# Patient Record
Sex: Male | Born: 1935 | Race: White | Hispanic: No | Marital: Single | Smoking: Former smoker
Health system: Southern US, Community
[De-identification: ages and names within clinical notes are randomized; demographics above are authoritative.]

## PROBLEM LIST (undated history)

## (undated) DIAGNOSIS — L98499 Non-pressure chronic ulcer of skin of other sites with unspecified severity: Secondary | ICD-10-CM

## (undated) DIAGNOSIS — G4701 Insomnia due to medical condition: Secondary | ICD-10-CM

## (undated) DIAGNOSIS — B353 Tinea pedis: Secondary | ICD-10-CM

## (undated) DIAGNOSIS — F329 Major depressive disorder, single episode, unspecified: Secondary | ICD-10-CM

## (undated) DIAGNOSIS — R110 Nausea: Secondary | ICD-10-CM

## (undated) DIAGNOSIS — R079 Chest pain, unspecified: Secondary | ICD-10-CM

## (undated) DIAGNOSIS — Z79899 Other long term (current) drug therapy: Secondary | ICD-10-CM

## (undated) DIAGNOSIS — G4700 Insomnia, unspecified: Secondary | ICD-10-CM

## (undated) DIAGNOSIS — R1013 Epigastric pain: Secondary | ICD-10-CM

## (undated) DIAGNOSIS — R197 Diarrhea, unspecified: Secondary | ICD-10-CM

## (undated) DIAGNOSIS — K3189 Other diseases of stomach and duodenum: Secondary | ICD-10-CM

## (undated) DIAGNOSIS — IMO0001 Reserved for inherently not codable concepts without codable children: Secondary | ICD-10-CM

## (undated) DIAGNOSIS — Z125 Encounter for screening for malignant neoplasm of prostate: Secondary | ICD-10-CM

## (undated) DIAGNOSIS — N189 Chronic kidney disease, unspecified: Secondary | ICD-10-CM

## (undated) DIAGNOSIS — R5383 Other fatigue: Secondary | ICD-10-CM

## (undated) DIAGNOSIS — R5381 Other malaise: Secondary | ICD-10-CM

## (undated) DIAGNOSIS — E039 Hypothyroidism, unspecified: Secondary | ICD-10-CM

## (undated) DIAGNOSIS — R9431 Abnormal electrocardiogram [ECG] [EKG]: Secondary | ICD-10-CM

## (undated) DIAGNOSIS — I4949 Other premature depolarization: Secondary | ICD-10-CM

## (undated) DIAGNOSIS — N401 Enlarged prostate with lower urinary tract symptoms: Secondary | ICD-10-CM

## (undated) DIAGNOSIS — N138 Other obstructive and reflux uropathy: Secondary | ICD-10-CM

## (undated) DIAGNOSIS — N400 Enlarged prostate without lower urinary tract symptoms: Secondary | ICD-10-CM

## (undated) DIAGNOSIS — I10 Essential (primary) hypertension: Secondary | ICD-10-CM

## (undated) HISTORY — DX: Insomnia, unspecified: G47.00

## (undated) HISTORY — PX: APPENDECTOMY: SHX54

## (undated) HISTORY — DX: Epigastric pain: R10.13

## (undated) HISTORY — DX: Benign prostatic hyperplasia with lower urinary tract symptoms: N40.1

## (undated) HISTORY — DX: Chronic kidney disease, unspecified: N18.9

## (undated) HISTORY — DX: Chest pain, unspecified: R07.9

## (undated) HISTORY — PX: NOSE SURGERY: SHX723

## (undated) HISTORY — DX: Other malaise: R53.83

## (undated) HISTORY — DX: Non-pressure chronic ulcer of skin of other sites with unspecified severity: L98.499

## (undated) HISTORY — DX: Benign prostatic hyperplasia without lower urinary tract symptoms: N40.0

## (undated) HISTORY — PX: PROSTATE SURGERY: SHX751

## (undated) HISTORY — DX: Insomnia due to medical condition: G47.01

## (undated) HISTORY — DX: Reserved for inherently not codable concepts without codable children: IMO0001

## (undated) HISTORY — DX: Other long term (current) drug therapy: Z79.899

## (undated) HISTORY — DX: Other obstructive and reflux uropathy: N13.8

## (undated) HISTORY — DX: Hypothyroidism, unspecified: E03.9

## (undated) HISTORY — DX: Other fatigue: R53.81

## (undated) HISTORY — DX: Encounter for screening for malignant neoplasm of prostate: Z12.5

## (undated) HISTORY — DX: Nausea: R11.0

## (undated) HISTORY — DX: Other diseases of stomach and duodenum: K31.89

## (undated) HISTORY — DX: Major depressive disorder, single episode, unspecified: F32.9

## (undated) HISTORY — PX: CATARACT EXTRACTION, BILATERAL: SHX1313

## (undated) HISTORY — PX: HERNIA REPAIR: SHX51

## (undated) HISTORY — DX: Essential (primary) hypertension: I10

## (undated) HISTORY — DX: Abnormal electrocardiogram (ECG) (EKG): R94.31

## (undated) HISTORY — DX: Diarrhea, unspecified: R19.7

## (undated) HISTORY — DX: Other premature depolarization: I49.49

## (undated) HISTORY — DX: Tinea pedis: B35.3

---

## 2002-08-24 HISTORY — PX: COLONOSCOPY: SHX174

## 2002-11-23 ENCOUNTER — Ambulatory Visit (HOSPITAL_COMMUNITY): Admission: RE | Admit: 2002-11-23 | Discharge: 2002-11-23 | Payer: Self-pay | Admitting: Gastroenterology

## 2002-11-23 ENCOUNTER — Encounter: Payer: Self-pay | Admitting: Gastroenterology

## 2003-11-14 ENCOUNTER — Encounter: Admission: RE | Admit: 2003-11-14 | Discharge: 2003-11-14 | Payer: Self-pay | Admitting: Internal Medicine

## 2005-04-18 IMAGING — CR DG CHEST 2V
2 series · 2 of 2 positions shown · non-contrast
Comparison: none

CLINICAL DATA: Chest pain. 
 TWO VIEW CHEST
 PA and lateral views without comparison films reveal the heart size to be normal with mild dilatation of the aorta.  There is hyperaeration of the lung fields with prominence of the markings.  No active findings.  Spurring is noted of the lower thoracic spine. 
 IMPRESSION
 Chronic changes are noted particularly in the lung apices.  No active findings.

[view not recorded (1 of 2)]
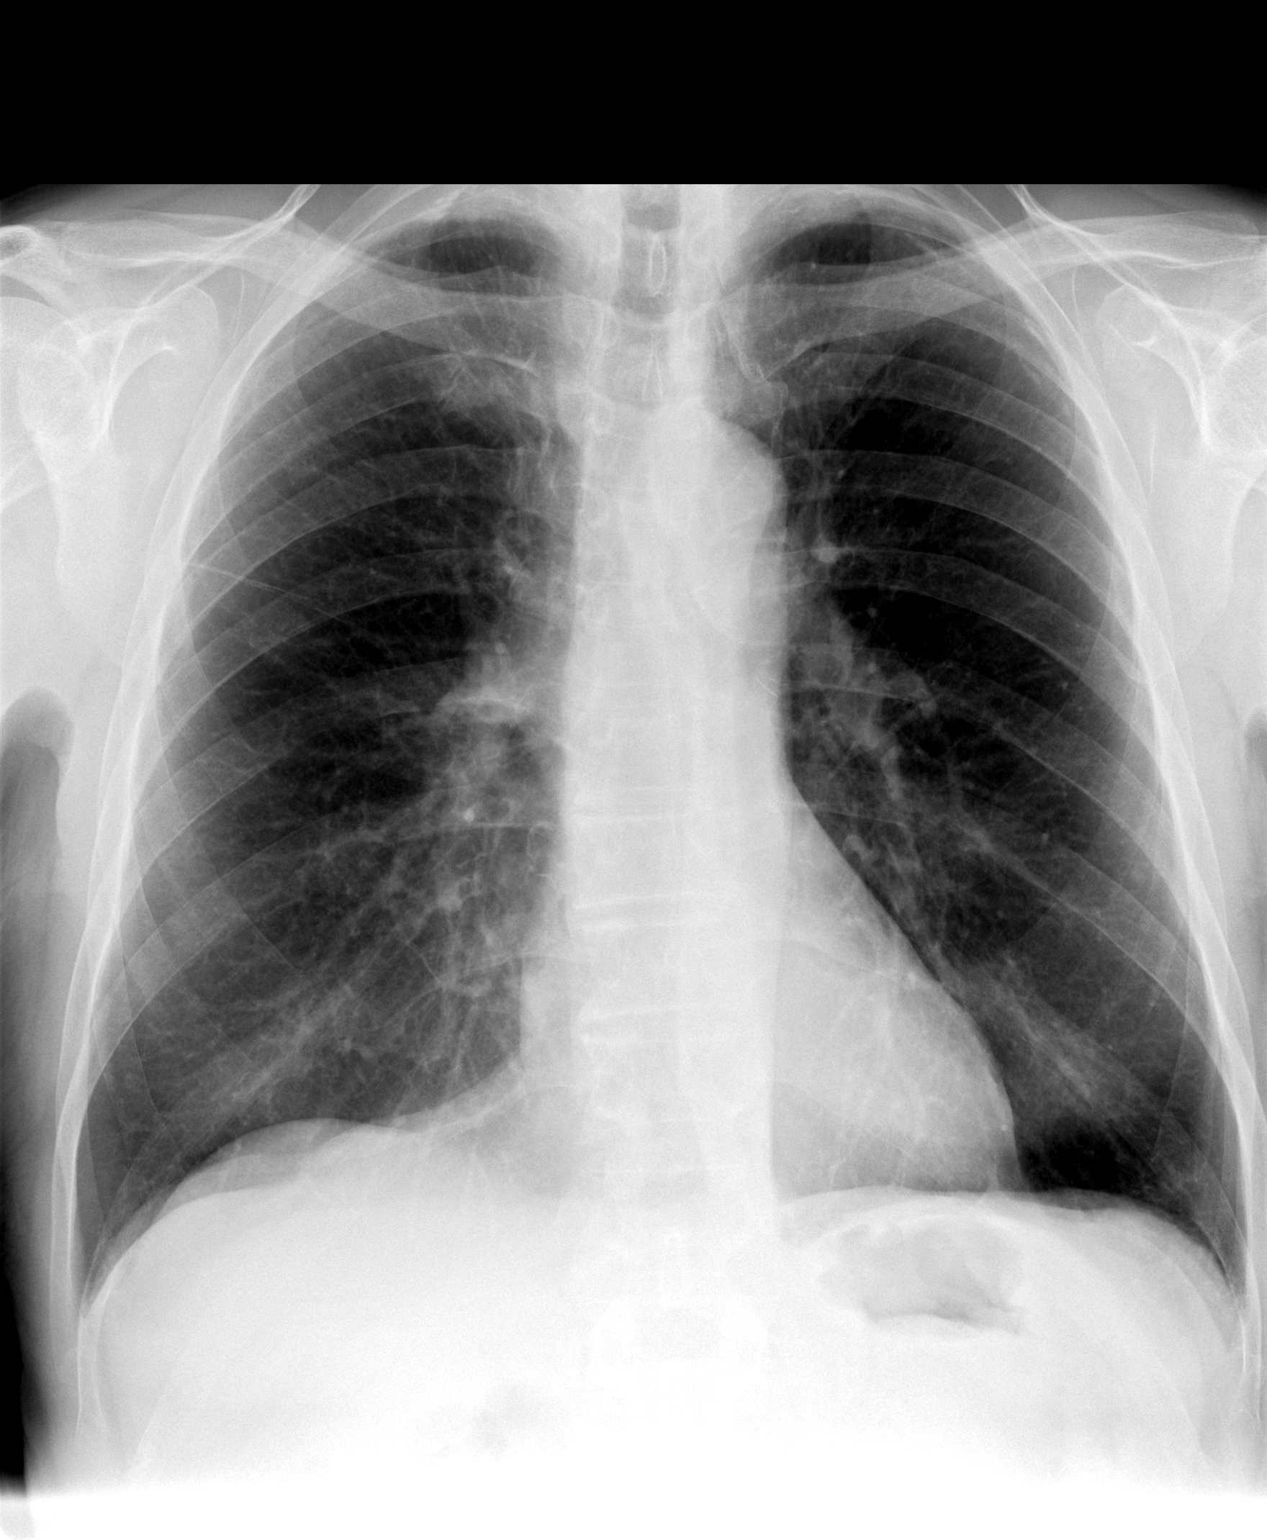

[view not recorded (2 of 2)]
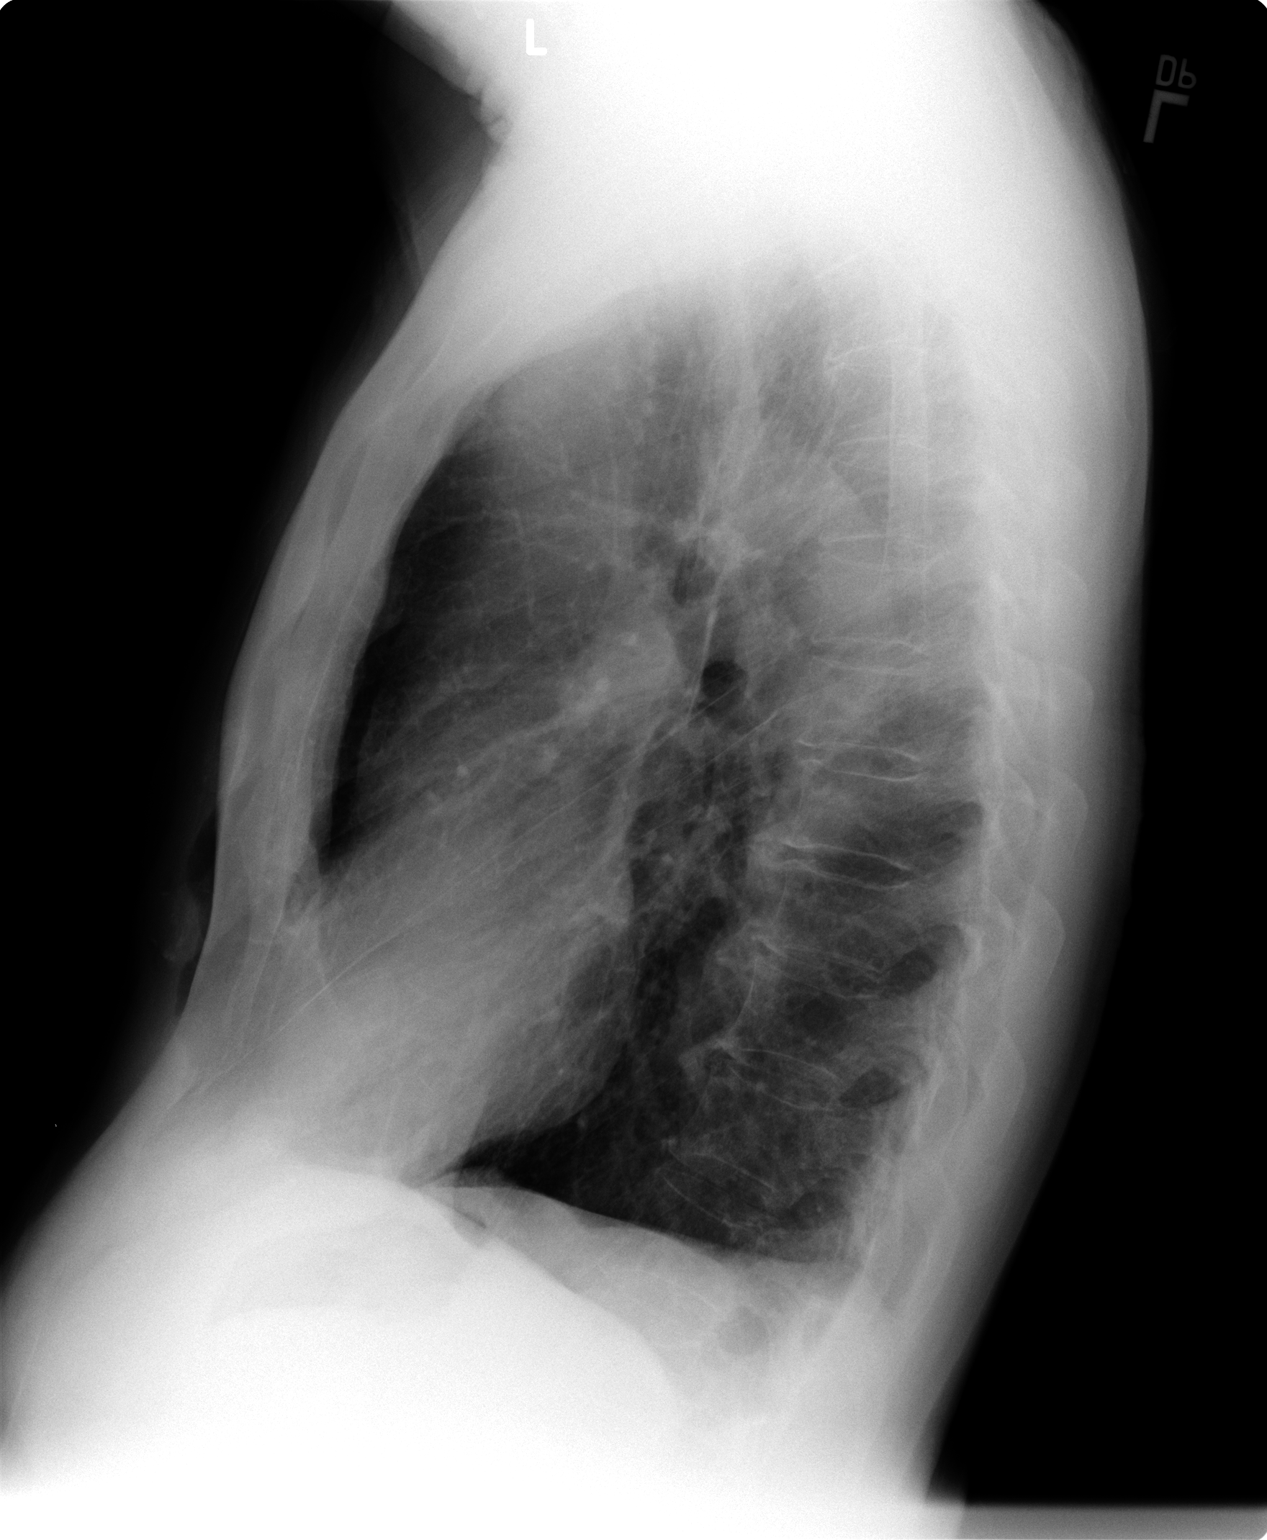

[2 of 2 positions shown; findings below may reference images not displayed]

## 2009-06-25 ENCOUNTER — Encounter (INDEPENDENT_AMBULATORY_CARE_PROVIDER_SITE_OTHER): Payer: Self-pay | Admitting: *Deleted

## 2011-09-28 DIAGNOSIS — R634 Abnormal weight loss: Secondary | ICD-10-CM | POA: Diagnosis not present

## 2011-09-28 DIAGNOSIS — E039 Hypothyroidism, unspecified: Secondary | ICD-10-CM | POA: Diagnosis not present

## 2011-09-28 DIAGNOSIS — N189 Chronic kidney disease, unspecified: Secondary | ICD-10-CM | POA: Diagnosis not present

## 2011-11-23 DIAGNOSIS — H353190 Nonexudative age-related macular degeneration, unspecified eye, stage unspecified: Secondary | ICD-10-CM | POA: Diagnosis not present

## 2011-11-23 DIAGNOSIS — Z961 Presence of intraocular lens: Secondary | ICD-10-CM | POA: Diagnosis not present

## 2011-11-23 DIAGNOSIS — H40019 Open angle with borderline findings, low risk, unspecified eye: Secondary | ICD-10-CM | POA: Diagnosis not present

## 2011-11-23 DIAGNOSIS — H35369 Drusen (degenerative) of macula, unspecified eye: Secondary | ICD-10-CM | POA: Diagnosis not present

## 2011-12-31 DIAGNOSIS — N189 Chronic kidney disease, unspecified: Secondary | ICD-10-CM | POA: Diagnosis not present

## 2011-12-31 DIAGNOSIS — E039 Hypothyroidism, unspecified: Secondary | ICD-10-CM | POA: Diagnosis not present

## 2012-01-04 DIAGNOSIS — E039 Hypothyroidism, unspecified: Secondary | ICD-10-CM | POA: Diagnosis not present

## 2012-01-04 DIAGNOSIS — N189 Chronic kidney disease, unspecified: Secondary | ICD-10-CM | POA: Diagnosis not present

## 2012-04-18 DIAGNOSIS — F329 Major depressive disorder, single episode, unspecified: Secondary | ICD-10-CM | POA: Diagnosis not present

## 2012-04-18 DIAGNOSIS — E039 Hypothyroidism, unspecified: Secondary | ICD-10-CM | POA: Diagnosis not present

## 2012-04-18 DIAGNOSIS — N189 Chronic kidney disease, unspecified: Secondary | ICD-10-CM | POA: Diagnosis not present

## 2012-08-15 DIAGNOSIS — N189 Chronic kidney disease, unspecified: Secondary | ICD-10-CM | POA: Diagnosis not present

## 2012-08-15 DIAGNOSIS — I10 Essential (primary) hypertension: Secondary | ICD-10-CM | POA: Diagnosis not present

## 2012-12-07 ENCOUNTER — Encounter: Payer: Self-pay | Admitting: Internal Medicine

## 2012-12-07 ENCOUNTER — Ambulatory Visit (INDEPENDENT_AMBULATORY_CARE_PROVIDER_SITE_OTHER): Payer: Medicare Other | Admitting: Internal Medicine

## 2012-12-07 VITALS — BP 142/80 | HR 70 | Temp 96.0°F | Resp 16 | Ht 67.5 in | Wt 151.6 lb

## 2012-12-07 DIAGNOSIS — I10 Essential (primary) hypertension: Secondary | ICD-10-CM

## 2012-12-07 DIAGNOSIS — F3289 Other specified depressive episodes: Secondary | ICD-10-CM

## 2012-12-07 DIAGNOSIS — E039 Hypothyroidism, unspecified: Secondary | ICD-10-CM

## 2012-12-07 DIAGNOSIS — G4700 Insomnia, unspecified: Secondary | ICD-10-CM | POA: Diagnosis not present

## 2012-12-07 DIAGNOSIS — F329 Major depressive disorder, single episode, unspecified: Secondary | ICD-10-CM | POA: Diagnosis not present

## 2012-12-07 DIAGNOSIS — F32A Depression, unspecified: Secondary | ICD-10-CM | POA: Insufficient documentation

## 2012-12-07 MED ORDER — MELATONIN 3 MG PO TABS: 1.0000 | ORAL_TABLET | Freq: Every day | ORAL | Status: DC

## 2012-12-07 NOTE — Progress Notes (Signed)
  Subjective:    Patient ID: Ricardo Alvarez, male    DOB: 1915-11-24, 79 y.o.   MRN: [id number]  HPI  Patient here for his routine follow up. He was seeing dr Leanord Hawking in past. He is very pleasant 79 y/o elderly male patient very hard of hearing. He has been having problem with his sleep cycle. He lives alone, independent with his daily activities, alert and oriented and extremely polite. He mentions his mood and energy level is fair with current regimen of medication. His bp remains stable. Denies any other complaints. See ros  Review of Systems  Constitutional: Negative for fever, chills, activity change, appetite change and fatigue.  HENT: Negative for congestion.   Eyes: Negative for discharge.  Respiratory: Negative for chest tightness and shortness of breath.   Cardiovascular: Negative for chest pain, palpitations and leg swelling.  Gastrointestinal: Negative for abdominal distention.  Genitourinary: Negative for dysuria.  Musculoskeletal: Positive for arthralgias.  Neurological: Negative for dizziness, syncope and speech difficulty.  Psychiatric/Behavioral: Positive for sleep disturbance. Negative for behavioral problems and agitation.       Objective:   Physical Exam  Constitutional: He is oriented to person, place, and time. He appears well-developed and well-nourished. No distress.  HENT:  Head: Normocephalic and atraumatic.  Eyes: Conjunctivae are normal. Pupils are equal, round, and reactive to light.  Neck: Normal range of motion. Neck supple. No JVD present.  Cardiovascular: Normal rate and regular rhythm.   Pulmonary/Chest: Effort normal and breath sounds normal.  Abdominal: Soft. Bowel sounds are normal. There is no guarding.  Musculoskeletal: Normal range of motion. He exhibits no edema and no tenderness.  Lymphadenopathy:    He has no cervical adenopathy.  Neurological: He is alert and oriented to person, place, and time.  Skin: Skin is warm and dry. No rash  noted. He is not diaphoretic. No pallor.  Very dry skin  Psychiatric: He has a normal mood and affect.   BP 142/80  Pulse 70  Temp(Src) 96 F (35.6 C)  Resp 16  Ht 5' 7.5" (1.715 m)  Wt 151 lb 9.6 oz (68.765 kg)  BMI 23.38 kg/m2     Assessment & Plan:   Depression- will continue with amitriptyline for now and monitor, seems helpful in the patient  Insomnia- continue trazodone and will add melatonin 3 mg daily for now and reassess. Avoid bzd/ hypnotics for now  htn- slightly elevated SBP this office. visit but no symptoms. Given his age, this bp reading is stable for him. Will not change med. Check bmp  Hypothyroidism- continue current dose of levothyroxine and recheck tsh this visit for need for dose adjustment

## 2012-12-08 LAB — BASIC METABOLIC PANEL
Calcium: 9.4 mg/dL (ref 8.6–10.2)
Creatinine, Ser: 1.74 mg/dL — ABNORMAL HIGH (ref 0.76–1.27)
GFR calc Af Amer: 38 mL/min/{1.73_m2} — ABNORMAL LOW (ref >59)
GFR calc non Af Amer: 33 mL/min/{1.73_m2} — ABNORMAL LOW (ref >59)
Sodium: 137 mmol/L (ref 134–144)

## 2012-12-14 ENCOUNTER — Other Ambulatory Visit: Payer: Self-pay | Admitting: *Deleted

## 2012-12-14 ENCOUNTER — Encounter: Payer: Self-pay | Admitting: *Deleted

## 2012-12-14 MED ORDER — LEVOTHYROXINE SODIUM 75 MCG PO TABS: ORAL_TABLET | ORAL | Status: DC

## 2013-02-09 ENCOUNTER — Ambulatory Visit: Payer: Self-pay | Admitting: Internal Medicine

## 2013-06-02 ENCOUNTER — Encounter: Payer: Self-pay | Admitting: Internal Medicine

## 2013-06-07 ENCOUNTER — Ambulatory Visit (INDEPENDENT_AMBULATORY_CARE_PROVIDER_SITE_OTHER): Payer: Medicare Other | Admitting: Internal Medicine

## 2013-06-07 ENCOUNTER — Encounter: Payer: Self-pay | Admitting: Internal Medicine

## 2013-06-07 VITALS — BP 142/60 | HR 104 | Temp 97.8°F | Wt 152.0 lb

## 2013-06-07 DIAGNOSIS — F329 Major depressive disorder, single episode, unspecified: Secondary | ICD-10-CM

## 2013-06-07 DIAGNOSIS — G4700 Insomnia, unspecified: Secondary | ICD-10-CM | POA: Diagnosis not present

## 2013-06-07 DIAGNOSIS — I10 Essential (primary) hypertension: Secondary | ICD-10-CM | POA: Diagnosis not present

## 2013-06-07 DIAGNOSIS — E039 Hypothyroidism, unspecified: Secondary | ICD-10-CM | POA: Diagnosis not present

## 2013-06-07 DIAGNOSIS — N529 Male erectile dysfunction, unspecified: Secondary | ICD-10-CM | POA: Diagnosis not present

## 2013-06-07 DIAGNOSIS — Z23 Encounter for immunization: Secondary | ICD-10-CM

## 2013-06-07 DIAGNOSIS — I471 Supraventricular tachycardia: Secondary | ICD-10-CM

## 2013-06-07 MED ORDER — TRAZODONE HCL 150 MG PO TABS: 150.0000 mg | ORAL_TABLET | Freq: Every day | ORAL | Status: DC

## 2013-06-07 MED ORDER — AMLODIPINE BESYLATE 5 MG PO TABS: 5.0000 mg | ORAL_TABLET | Freq: Every day | ORAL | Status: DC

## 2013-06-07 MED ORDER — TRIAMCINOLONE ACETONIDE 0.1 % EX CREA: 1.0000 "application " | TOPICAL_CREAM | Freq: Two times a day (BID) | CUTANEOUS | Status: DC

## 2013-06-07 MED ORDER — METOPROLOL SUCCINATE ER 25 MG PO TB24: 25.0000 mg | ORAL_TABLET | Freq: Every day | ORAL | Status: DC

## 2013-06-07 MED ORDER — AMITRIPTYLINE HCL 75 MG PO TABS: 75.0000 mg | ORAL_TABLET | Freq: Every day | ORAL | Status: DC

## 2013-06-07 NOTE — Progress Notes (Signed)
Patient ID: Ricardo Alvarez, male   DOB: 1915/12/13, 79 y.o.   MRN: [id number]  Chief Complaint  Patient presents with  . Medical Managment of Chronic Issues    6 month f/u    HPI Patient here for his routine follow up. He is very hard of hearing and has hearing aid. He lives alone, independent with his daily activities, alert and oriented and extremely polite. He mentions his mood and energy level is fair with current regimen of medication. His bp remains stable Denies any complaints. See ros. His hearing impairment limits his history taking somewhat  Review of Systems  Constitutional: Negative for fever, chills, activity change, appetite change and fatigue.  HENT: Negative for congestion.   Eyes: Negative for discharge.  Respiratory: Negative for chest tightness and shortness of breath.   Cardiovascular: Negative for chest pain, palpitations and leg swelling.  Gastrointestinal: Negative for abdominal distention.  Genitourinary: Negative for dysuria.  Musculoskeletal: Positive for arthralgias.  Neurological: Negative for dizziness, syncope and speech difficulty.  Psychiatric/Behavioral: Negative for behavioral problems and agitation. Trazodone and amitryptyline have been helpful   Past Medical History  Diagnosis Date  . Essential hypertension, benign   . Major depressive disorder, single episode, unspecified   . Dermatophytosis of foot   . Nausea alone   . Insomnia due to medical condition classified elsewhere   . Chronic kidney disease, unspecified   . Unspecified hypothyroidism   . Other premature beats   . Hypertrophy of prostate with urinary obstruction and other lower urinary tract symptoms (LUTS)   . Nonspecific abnormal electrocardiogram (ECG) (EKG)   . Hypertrophy of prostate without urinary obstruction and other lower urinary tract symptoms (LUTS)   . Nonspecific abnormal electrocardiogram (ECG) (EKG)   . Hypertrophy of prostate without urinary obstruction and other  lower urinary tract symptoms (LUTS)   . Myalgia and myositis, unspecified   . Other malaise and fatigue   . Encounter for long-term (current) use of other medications   . Special screening for malignant neoplasm of prostate   . Dyspepsia and other specified disorders of function of stomach   . Chest pain, unspecified   . Chronic ulcer of other specified site   . Diarrhea   . Insomnia, unspecified    Past Surgical History  Procedure Laterality Date  . Prostate surgery      5 times Dr Retia Passe  . Hernia repair    . Cataract extraction, bilateral    . Appendectomy    . Nose surgery     No Known Allergies Current Outpatient Prescriptions on File Prior to Visit  Medication Sig Dispense Refill  . calcium carbonate (TUMS LASTING EFFECTS) 500 MG chewable tablet Chew 1 tablet by mouth daily. Take one tablet at bedtime      . levothyroxine (SYNTHROID, LEVOTHROID) 75 MCG tablet Take one tablet once daily for thyroid  90 tablet  3   No current facility-administered medications on file prior to visit.   BP 142/60  Pulse 104  Temp(Src) 97.8 F (36.6 C) (Oral)  Wt 152 lb (68.947 kg)  BMI 23.44 kg/m2  SpO2 95%  Physical Exam  Constitutional: He is oriented to person, place, and time. He appears well-developed and well-nourished. No distress.  HENT:   Head: Normocephalic and atraumatic.  Eyes: Conjunctivae are normal. Pupils are equal, round, and reactive to light.  Neck: Normal range of motion. Neck supple. No JVD present.  Cardiovascular: sinus tachycardia, no murmur Pulmonary/Chest: Effort normal and breath sounds  normal.  Abdominal: Soft. Bowel sounds are normal. There is no guarding.  Musculoskeletal: Normal range of motion. He exhibits no tenderness. He has pitting edema of 1+ in both lower extremities Lymphadenopathy:    He has no cervical adenopathy.  Neurological: He is alert and oriented to person, place, and time.  Skin: Skin is warm and dry. No rash noted. He is not  diaphoretic. No pallor.  Very dry skin  Psychiatric: He has a normal mood and affect.   Labs  CMP     Component Value Date/Time   NA 137 12/07/2012 1406   K 4.5 12/07/2012 1406   CL 100 12/07/2012 1406   CO2 24 12/07/2012 1406   GLUCOSE 93 12/07/2012 1406   BUN 21 12/07/2012 1406   CREATININE 1.74* 12/07/2012 1406   CALCIUM 9.4 12/07/2012 1406   GFRNONAA 33* 12/07/2012 1406   GFRAA 38* 12/07/2012 1406   tsh 5.310 (4/16)  06/07/13 ekg- HR 90/min, sinus rhythma, normal PR interval, normal QRS  Assessment/plan  Tachycardia- new onset, regular heart rate on exam. Will start him on toprol xl 25 mg daily to help with his heart rate and his blood pressure. Reassess in a month  Hypertension- will d/c amlodipine given his leg swelling and start toprol xl 25 mg daily for now to help with bp and heart rate.   Hypothyroidism- continue current dose of levothyroxine and recheck tsh today  Depression- will continue with amitriptyline for now and monitor, seems helpful in the patient  Insomnia- continue trazodone for now  Influenza vaccine provided  Lab work done

## 2013-06-07 NOTE — Patient Instructions (Signed)
You will stop taking your amlodipine from today You will start taking your new blood pressure medication toprol xl from tomorrow morning. This will help with your heart rate as well. If you notice lightheadedness or dizziness from this medication, please notify the office immediately

## 2013-06-08 NOTE — Addendum Note (Signed)
Addended by: Waymond Cera on: 06/08/2013 09:10 AM   Modules accepted: Orders

## 2013-06-09 ENCOUNTER — Encounter: Payer: Self-pay | Admitting: *Deleted

## 2013-06-09 ENCOUNTER — Other Ambulatory Visit: Payer: Self-pay | Admitting: *Deleted

## 2013-06-09 LAB — COMPREHENSIVE METABOLIC PANEL
ALT: 12 IU/L (ref 0–44)
AST: 18 IU/L (ref 0–40)
Albumin: 4.4 g/dL (ref 3.2–4.6)
Alkaline Phosphatase: 74 IU/L (ref 39–117)
BUN/Creatinine Ratio: 12 (ref 10–22)
BUN: 21 mg/dL (ref 10–36)
CO2: 23 mmol/L (ref 18–29)
Chloride: 96 mmol/L — ABNORMAL LOW (ref 97–108)
GFR calc Af Amer: 38 mL/min/{1.73_m2} — ABNORMAL LOW (ref >59)
Potassium: 5.2 mmol/L (ref 3.5–5.2)
Total Bilirubin: 0.4 mg/dL (ref 0.0–1.2)

## 2013-06-09 LAB — CBC WITH DIFFERENTIAL/PLATELET
Basophils Absolute: 0 10*3/uL (ref 0.0–0.2)
Eos: 1 %
Immature Grans (Abs): 0 10*3/uL (ref 0.0–0.1)
Immature Granulocytes: 0 %
MCHC: 34.2 g/dL (ref 31.5–35.7)
Monocytes Absolute: 0.6 10*3/uL (ref 0.1–0.9)
Monocytes: 8 %
Neutrophils Relative %: 72 %
RDW: 13.6 % (ref 12.3–15.4)
WBC: 8.3 10*3/uL (ref 3.4–10.8)

## 2013-06-09 LAB — TESTOSTERONE, FREE, TOTAL, SHBG
Testosterone, Free: 6.9 pg/mL (ref 6.6–18.1)
Testosterone, total: 770.8 ng/dL (ref 348.0–1197.0)

## 2013-06-09 MED ORDER — LEVOTHYROXINE SODIUM 50 MCG PO TABS: 50.0000 ug | ORAL_TABLET | Freq: Every day | ORAL | Status: DC

## 2013-06-09 NOTE — Telephone Encounter (Signed)
rx filled per protocal 

## 2013-06-15 NOTE — Progress Notes (Signed)
Was unable to reach patient by phone. Patient is also VERY hard of hearing. Mailed a letter to patient with appointment time & date.

## 2013-07-05 ENCOUNTER — Ambulatory Visit (INDEPENDENT_AMBULATORY_CARE_PROVIDER_SITE_OTHER): Payer: Medicare Other | Admitting: Internal Medicine

## 2013-07-05 ENCOUNTER — Encounter: Payer: Self-pay | Admitting: Internal Medicine

## 2013-07-05 VITALS — BP 124/70 | HR 84 | Temp 97.6°F | Wt 149.0 lb

## 2013-07-05 DIAGNOSIS — R000 Tachycardia, unspecified: Secondary | ICD-10-CM | POA: Insufficient documentation

## 2013-07-05 DIAGNOSIS — F329 Major depressive disorder, single episode, unspecified: Secondary | ICD-10-CM | POA: Diagnosis not present

## 2013-07-05 DIAGNOSIS — E039 Hypothyroidism, unspecified: Secondary | ICD-10-CM

## 2013-07-05 DIAGNOSIS — I10 Essential (primary) hypertension: Secondary | ICD-10-CM | POA: Diagnosis not present

## 2013-07-05 MED ORDER — METOPROLOL SUCCINATE ER 25 MG PO TB24: 25.0000 mg | ORAL_TABLET | Freq: Every day | ORAL | Status: DC

## 2013-07-05 MED ORDER — LEVOTHYROXINE SODIUM 50 MCG PO TABS: 50.0000 ug | ORAL_TABLET | Freq: Every day | ORAL | Status: DC

## 2013-07-05 NOTE — Progress Notes (Signed)
Patient ID: Ricardo Alvarez, male   DOB: 02-04-1916, 79 y.o.   MRN: [id number]  Chief Complaint  Patient presents with  . Medical Managment of Chronic Issues    1 month f/u    No Known Allergies  HPI Patient here for follow up on heart rate and blood pressure.  He is very hard of hearing and has hearing aid. His heart rate is well controlled and bp is normal.  Denies any complaints. See ros. His hearing impairment limits his history taking somewhat  Review of Systems   Constitutional: Negative for fever, chills, activity change, appetite change and fatigue.   HENT: Negative for congestion.    Eyes: Negative for discharge.   Respiratory: Negative for chest tightness and shortness of breath.    Cardiovascular: Negative for chest pain, palpitations and leg swelling.   Gastrointestinal: Negative for abdominal distention.   Genitourinary: Negative for dysuria.   Musculoskeletal: Positive for arthralgias.   Neurological: Negative for dizziness, syncope and speech difficulty.   Psychiatric/Behavioral: Negative for behavioral problems and agitation. Ricardo Alvarez and Ricardo Alvarez have been helpful  Past Medical History  Diagnosis Date  . Essential hypertension, benign   . Major depressive disorder, single episode, unspecified   . Dermatophytosis of foot   . Nausea alone   . Insomnia due to medical condition classified elsewhere   . Chronic kidney disease, unspecified   . Unspecified hypothyroidism   . Other premature beats   . Hypertrophy of prostate with urinary obstruction and other lower urinary tract symptoms (LUTS)   . Nonspecific abnormal electrocardiogram (ECG) (EKG)   . Hypertrophy of prostate without urinary obstruction and other lower urinary tract symptoms (LUTS)   . Nonspecific abnormal electrocardiogram (ECG) (EKG)   . Hypertrophy of prostate without urinary obstruction and other lower urinary tract symptoms (LUTS)   . Myalgia and myositis, unspecified   . Other malaise  and fatigue   . Encounter for long-term (current) use of other medications   . Special screening for malignant neoplasm of prostate   . Dyspepsia and other specified disorders of function of stomach   . Chest pain, unspecified   . Chronic ulcer of other specified site   . Diarrhea   . Insomnia, unspecified    Current Outpatient Prescriptions on File Prior to Visit  Medication Sig Dispense Refill  . amitriptyline (ELAVIL) 75 MG tablet Take 1 tablet (75 mg total) by mouth at bedtime. Take one tablet at bedtime  90 tablet  1  . calcium carbonate (TUMS LASTING EFFECTS) 500 MG chewable tablet Chew 1 tablet by mouth daily. Take one tablet at bedtime      . Ricardo Alvarez (DESYREL) 150 MG tablet Take 1 tablet (150 mg total) by mouth at bedtime. to help with sleep  90 tablet  3  . triamcinolone cream (KENALOG) 0.1 % Apply 1 application topically 2 (two) times daily. Apply as needed for itching  30 g  0   No current facility-administered medications on file prior to visit.   Physical Exam   BP 124/70  Pulse 84  Temp(Src) 97.6 F (36.4 C) (Oral)  Wt 149 lb (67.586 kg)  SpO2 96%  Constitutional: He is oriented to person, place, and time. He appears well-developed and well-nourished. No distress.   HENT:   Head: Normocephalic and atraumatic.   Eyes: Conjunctivae are normal. Pupils are equal, round, and reactive to light.   Neck: Normal range of motion. Neck supple. No JVD present.   Cardiovascular: controlled heart rate, no  murmur Pulmonary/Chest: Effort normal and breath sounds normal.   Abdominal: Soft. Bowel sounds are normal. There is no guarding.  Musculoskeletal: Normal range of motion. He exhibits no tenderness. He has pitting edema of 1+ in both lower extremities Lymphadenopathy:    He has no cervical adenopathy.  Neurological: He is alert and oriented to person, place, and time.   Skin: Skin is warm and dry. No rash noted. He is not diaphoretic. No pallor.  Very dry skin   Psychiatric: He has a normal mood and affect.   Labs- Lab Results  Component Value Date   TSH 0.363* 06/07/2013   Assessment/plan  1. Hypothyroidism Continue levothyroxine 50 mcg daily. Refills provided Recheck TSH  2. HTN (hypertension) bp well contorlled with coreg. Continue this for now. Warning signs with elevated bp explained  3. Tachycardia Rate controlled and sinus rhythm. Continue his coreg 25 mg daily  4. Depression Stable on current regimen, no changes made this visit

## 2013-07-06 ENCOUNTER — Encounter: Payer: Self-pay | Admitting: *Deleted

## 2013-07-06 ENCOUNTER — Other Ambulatory Visit: Payer: Self-pay | Admitting: *Deleted

## 2013-07-06 ENCOUNTER — Telehealth: Payer: Self-pay | Admitting: *Deleted

## 2013-07-06 LAB — TSH

## 2013-07-06 NOTE — Telephone Encounter (Signed)
Message copied by Waymond Cera on Thu Jul 06, 2013  2:41 PM ------      Message from: Oneal Grout      Created: Thu Jul 06, 2013 11:58 AM       Your thyroid function has worsened. We need to increase your levothyroxine to 75 mcg daily for now. He has a new script for 50 mcg. Can we have pharmacy change it to 75 if pt has not picked up the supply. Karolee Ohs give him 25 mcg and have him take it with 50 mcg. Pt is hard of hearing. Make sure he understands the message. Also to take it first thing in the morning, empty stomach ------

## 2013-07-06 NOTE — Telephone Encounter (Signed)
rx called to pharmacy and pt notified as well as letter mailed with instructions.

## 2013-09-06 ENCOUNTER — Ambulatory Visit: Payer: Medicare Other | Admitting: Internal Medicine

## 2013-09-19 ENCOUNTER — Encounter: Payer: Self-pay | Admitting: Internal Medicine

## 2013-09-19 ENCOUNTER — Ambulatory Visit (INDEPENDENT_AMBULATORY_CARE_PROVIDER_SITE_OTHER): Payer: Medicare Other | Admitting: Internal Medicine

## 2013-09-19 VITALS — BP 154/82 | HR 66 | Temp 97.7°F | Wt 152.2 lb

## 2013-09-19 DIAGNOSIS — E039 Hypothyroidism, unspecified: Secondary | ICD-10-CM | POA: Diagnosis not present

## 2013-09-19 DIAGNOSIS — F3289 Other specified depressive episodes: Secondary | ICD-10-CM

## 2013-09-19 DIAGNOSIS — G4700 Insomnia, unspecified: Secondary | ICD-10-CM

## 2013-09-19 DIAGNOSIS — L309 Dermatitis, unspecified: Secondary | ICD-10-CM

## 2013-09-19 DIAGNOSIS — F32A Depression, unspecified: Secondary | ICD-10-CM

## 2013-09-19 DIAGNOSIS — F329 Major depressive disorder, single episode, unspecified: Secondary | ICD-10-CM | POA: Diagnosis not present

## 2013-09-19 DIAGNOSIS — L259 Unspecified contact dermatitis, unspecified cause: Secondary | ICD-10-CM

## 2013-09-19 DIAGNOSIS — I10 Essential (primary) hypertension: Secondary | ICD-10-CM | POA: Diagnosis not present

## 2013-09-19 MED ORDER — HYDROCORTISONE 2.5 % EX LOTN: TOPICAL_LOTION | Freq: Two times a day (BID) | CUTANEOUS | Status: DC

## 2013-09-19 MED ORDER — CETIRIZINE HCL 5 MG PO TABS: ORAL_TABLET | ORAL | Status: DC

## 2013-09-19 MED ORDER — AMLODIPINE BESYLATE 5 MG PO TABS: 5.0000 mg | ORAL_TABLET | Freq: Every day | ORAL | Status: DC

## 2013-09-19 NOTE — Progress Notes (Signed)
Patient ID: Ricardo Alvarez, male   DOB: 12-Sep-1915, 80 y.o.   MRN: [id number]    No Known Allergies  Chief Complaint  Patient presents with  . Medical Managment of Chronic Issues    3 month f/u, no recent labs   HPI 80 y/o male pt here for RV.He is very hard of hearing and has hearing aid. His heart rate is well controlled but SBP is elevated. He complaints of itching on his chest and back and abdomen region. Has not tried anything for itching. No rash noted  Review of Systems   Constitutional: Negative for fever, chills, activity change, appetite change and fatigue.   HENT: Negative for congestion.    Eyes: Negative for discharge.   Respiratory: Negative for chest tightness and shortness of breath.    Cardiovascular: Negative for chest pain, palpitations and leg swelling.   Gastrointestinal: Negative for abdominal distention.   Genitourinary: Negative for dysuria.   Musculoskeletal: Positive for arthralgias.   Neurological: Negative for dizziness, syncope and speech difficulty.   Psychiatric/Behavioral: Negative for behavioral problems and agitation. Trazodone and amitryptyline have been helpful  Past Medical History  Diagnosis Date  . Essential hypertension, benign   . Major depressive disorder, single episode, unspecified   . Dermatophytosis of foot   . Nausea alone   . Insomnia due to medical condition classified elsewhere   . Chronic kidney disease, unspecified   . Unspecified hypothyroidism   . Other premature beats   . Hypertrophy of prostate with urinary obstruction and other lower urinary tract symptoms (LUTS)   . Nonspecific abnormal electrocardiogram (ECG) (EKG)   . Hypertrophy of prostate without urinary obstruction and other lower urinary tract symptoms (LUTS)   . Nonspecific abnormal electrocardiogram (ECG) (EKG)   . Hypertrophy of prostate without urinary obstruction and other lower urinary tract symptoms (LUTS)   . Myalgia and myositis, unspecified   .  Other malaise and fatigue   . Encounter for long-term (current) use of other medications   . Special screening for malignant neoplasm of prostate   . Dyspepsia and other specified disorders of function of stomach   . Chest pain, unspecified   . Chronic ulcer of other specified site   . Diarrhea   . Insomnia, unspecified    Current Outpatient Prescriptions on File Prior to Visit  Medication Sig Dispense Refill  . amitriptyline (ELAVIL) 75 MG tablet Take 1 tablet (75 mg total) by mouth at bedtime. Take one tablet at bedtime  90 tablet  1  . calcium carbonate (TUMS LASTING EFFECTS) 500 MG chewable tablet Chew 1 tablet by mouth daily. Take one tablet at bedtime      . levothyroxine (SYNTHROID, LEVOTHROID) 25 MCG tablet Take 25 mcg by mouth daily before breakfast.      . levothyroxine (SYNTHROID, LEVOTHROID) 50 MCG tablet Take 1 tablet (50 mcg total) by mouth daily before breakfast.  90 tablet  3  . metoprolol succinate (TOPROL-XL) 25 MG 24 hr tablet Take 1 tablet (25 mg total) by mouth daily.  90 tablet  3  . traZODone (DESYREL) 150 MG tablet Take 1 tablet (150 mg total) by mouth at bedtime. to help with sleep  90 tablet  3  . triamcinolone cream (KENALOG) 0.1 % Apply 1 application topically 2 (two) times daily. Apply as needed for itching  30 g  0   No current facility-administered medications on file prior to visit.    Physical exam BP 154/82  Pulse 66  Temp(Src) 97.7  F (36.5 C) (Oral)  Wt 152 lb 3.2 oz (69.037 kg)  Constitutional: He is oriented to person, place, and time. He appears well-developed and well-nourished. No distress.   HENT:   Head: Normocephalic and atraumatic.   Eyes: Conjunctivae are normal. Pupils are equal, round, and reactive to light.   Neck: Normal range of motion. Neck supple. No JVD present.   Cardiovascular: controlled heart rate, no murmur Pulmonary/Chest: Effort normal and breath sounds normal.   Abdominal: Soft. Bowel sounds are normal. There is no  guarding.  Musculoskeletal: Normal range of motion. He exhibits no tenderness. He has pitting edema of 1+ in both lower extremities Lymphadenopathy:    He has no cervical adenopathy.  Neurological: He is alert and oriented to person, place, and time.   Skin: Skin is warm and dry. No rash noted. He is not diaphoretic. No pallor.  Very dry skin  Psychiatric: He has a normal mood and affect.   Labs- CBC    Component Value Date/Time   WBC 8.3 06/07/2013 1354   RBC 4.68 06/07/2013 1354   HGB 14.6 06/07/2013 1354   HCT 42.7 06/07/2013 1354   MCV 91 06/07/2013 1354   MCH 31.2 06/07/2013 1354   MCHC 34.2 06/07/2013 1354   RDW 13.6 06/07/2013 1354   LYMPHSABS 1.5 06/07/2013 1354   EOSABS 0.1 06/07/2013 1354   BASOSABS 0.0 06/07/2013 1354    Lab Results  Component Value Date   TSH 6.330* 07/05/2013   Assessment/plan  1. Hypothyroidism Continue levothyroxine 75 mcg daily, check tsh prior to next visit - TSH; Future  2. Insomnia Continue trazodone, no changes  3. Depression Stable with elavil for now  4. Eczema Will have him apply hydrocortisone 2.5% lotion bid and then take prn cetirizine for itching  5. HTN (hypertension) Elevated bp, will add amlodipine 5 mg daily and continue toprol xl 25 mg daily. reassess

## 2013-12-20 ENCOUNTER — Encounter: Payer: Self-pay | Admitting: Internal Medicine

## 2013-12-20 ENCOUNTER — Ambulatory Visit (INDEPENDENT_AMBULATORY_CARE_PROVIDER_SITE_OTHER): Payer: Medicare Other | Admitting: Internal Medicine

## 2013-12-20 VITALS — BP 144/80 | HR 75 | Temp 97.6°F | Wt 151.6 lb

## 2013-12-20 DIAGNOSIS — F329 Major depressive disorder, single episode, unspecified: Secondary | ICD-10-CM | POA: Diagnosis not present

## 2013-12-20 DIAGNOSIS — F32A Depression, unspecified: Secondary | ICD-10-CM

## 2013-12-20 DIAGNOSIS — F3289 Other specified depressive episodes: Secondary | ICD-10-CM

## 2013-12-20 DIAGNOSIS — G4700 Insomnia, unspecified: Secondary | ICD-10-CM | POA: Diagnosis not present

## 2013-12-20 DIAGNOSIS — I10 Essential (primary) hypertension: Secondary | ICD-10-CM

## 2013-12-20 DIAGNOSIS — E039 Hypothyroidism, unspecified: Secondary | ICD-10-CM

## 2013-12-20 NOTE — Progress Notes (Signed)
Patient ID: Ricardo Alvarez, male   DOB: 1916/08/14, 80 y.o.   MRN: [id number]    Chief Complaint  Patient presents with  . Medical Management of Chronic Issues    3 month f/u with no recent labs  . Immunizations    declines Shingles & Tdap today   HPI 80 y/o male pt here for his routine visit.He is very hard of hearing and has hearing aid. His bp and heart rate are controlled this visit. He was started on amlodipine 5 mg daily last visit and is taking this but has stopped his metoprolol by himself. His heart rate is well controlled. He is compliant with other medication. Denies any complaints this visit  Review of Systems   Constitutional: Negative for fever, chills, activity change, appetite change and fatigue.   HENT: Negative for congestion.    Eyes: Negative for discharge.   Respiratory: Negative for chest tightness and shortness of breath.    Cardiovascular: Negative for chest pain, palpitations and leg swelling.   Gastrointestinal: Negative for abdominal distention.   Genitourinary: Negative for dysuria.   Musculoskeletal: Positive for arthralgia Neurological: Negative for dizziness, syncope and speech difficulty.   Psychiatric/Behavioral: Negative for behavioral problems and agitation. Trazodone and amitryptyline have been helpful and he uses them on as needed basis. He does not want them weaned  Past Medical History  Diagnosis Date  . Essential hypertension, benign   . Major depressive disorder, single episode, unspecified   . Dermatophytosis of foot   . Nausea alone   . Insomnia due to medical condition classified elsewhere   . Chronic kidney disease, unspecified   . Unspecified hypothyroidism   . Other premature beats   . Hypertrophy of prostate with urinary obstruction and other lower urinary tract symptoms (LUTS)   . Nonspecific abnormal electrocardiogram (ECG) (EKG)   . Hypertrophy of prostate without urinary obstruction and other lower urinary tract symptoms  (LUTS)   . Nonspecific abnormal electrocardiogram (ECG) (EKG)   . Hypertrophy of prostate without urinary obstruction and other lower urinary tract symptoms (LUTS)   . Myalgia and myositis, unspecified   . Other malaise and fatigue   . Encounter for long-term (current) use of other medications   . Special screening for malignant neoplasm of prostate   . Dyspepsia and other specified disorders of function of stomach   . Chest pain, unspecified   . Chronic ulcer of other specified site   . Diarrhea   . Insomnia, unspecified    Past Surgical History  Procedure Laterality Date  . Prostate surgery      5 times Dr Retia PasseJohn Alvarez  . Hernia repair    . Cataract extraction, bilateral    . Appendectomy    . Nose surgery    . Colonoscopy  2004    Dr.Patterson, Diverticulosis    Medication reviewed. [facility]  Physical exam BP 144/80  Pulse 75  Temp(Src) 97.6 F (36.4 C) (Oral)  Wt 151 lb 9.6 oz (68.765 kg)  SpO2 95%  Constitutional: He is oriented to person, place, and time. He appears well-developed and well-nourished. No distress.   HENT:   Head: Normocephalic and atraumatic.   Eyes: Conjunctivae are normal. Pupils are equal, round, and reactive to light.   Neck: Normal range of motion. Neck supple. No JVD present.   Cardiovascular: controlled heart rate, no murmur Pulmonary/Chest: Effort normal and breath sounds normal.   Abdominal: Soft. Bowel sounds are normal. There is no guarding.  Musculoskeletal: Normal range  of motion. He exhibits no tenderness. He has pitting edema of 1+ in both lower extremities Lymphadenopathy:    He has no cervical adenopathy.  Neurological: He is alert and oriented to person, place, and time.   Skin: Skin is warm and excessively dry. It is fragile. No rash noted. He is not diaphoretic. No pallor.  Psychiatric: He has a normal mood and affect.     Lab Results  Component Value Date   TSH 6.330* 07/05/2013    CBC    Component Value Date/Time    WBC 8.3 06/07/2013 1354   RBC 4.68 06/07/2013 1354   HGB 14.6 06/07/2013 1354   HCT 42.7 06/07/2013 1354   MCV 91 06/07/2013 1354   MCH 31.2 06/07/2013 1354   MCHC 34.2 06/07/2013 1354   RDW 13.6 06/07/2013 1354   LYMPHSABS 1.5 06/07/2013 1354   EOSABS 0.1 06/07/2013 1354   BASOSABS 0.0 06/07/2013 1354   CMP     Component Value Date/Time   NA 140 06/07/2013 1354   K 5.2 06/07/2013 1354   CL 96* 06/07/2013 1354   CO2 23 06/07/2013 1354   GLUCOSE 111* 06/07/2013 1354   BUN 21 06/07/2013 1354   CREATININE 1.72* 06/07/2013 1354   CALCIUM 9.3 06/07/2013 1354   PROT 6.7 06/07/2013 1354   AST 18 06/07/2013 1354   ALT 12 06/07/2013 1354   ALKPHOS 74 06/07/2013 1354   BILITOT 0.4 06/07/2013 1354   GFRNONAA 33* 06/07/2013 1354   GFRAA 38* 06/07/2013 1354    Assessment/plan  1. HTN (hypertension) Stable. Will continue amlodipine 5 mg daily only as pt has stopped metoprolol for some time now - Basic Metabolic Panel  2. Hypothyroidism Continue levothyroxine 75 mcg daily, check tsh - TSH - Basic Metabolic Panel  3. Insomnia Continue trazodone  4. Depression Stable with elavil for now but uses it prn and does not want dose change or reduction  Labs- tsh, bmp

## 2013-12-21 LAB — BASIC METABOLIC PANEL
BUN/Creatinine Ratio: 12 (ref 10–22)
BUN: 21 mg/dL (ref 10–36)
CALCIUM: 9 mg/dL (ref 8.6–10.2)
CHLORIDE: 99 mmol/L (ref 97–108)
CO2: 23 mmol/L (ref 18–29)
Creatinine, Ser: 1.77 mg/dL — ABNORMAL HIGH (ref 0.76–1.27)
GFR calc Af Amer: 37 mL/min/{1.73_m2} — ABNORMAL LOW (ref >59)
GFR calc non Af Amer: 32 mL/min/{1.73_m2} — ABNORMAL LOW (ref >59)
GLUCOSE: 94 mg/dL (ref 65–99)
POTASSIUM: 5 mmol/L (ref 3.5–5.2)
Sodium: 138 mmol/L (ref 134–144)

## 2013-12-21 LAB — TSH: TSH: 1.81 u[IU]/mL (ref 0.450–4.500)

## 2013-12-22 ENCOUNTER — Encounter: Payer: Self-pay | Admitting: *Deleted

## 2014-04-18 ENCOUNTER — Encounter: Payer: Self-pay | Admitting: Internal Medicine

## 2014-04-18 ENCOUNTER — Ambulatory Visit (INDEPENDENT_AMBULATORY_CARE_PROVIDER_SITE_OTHER): Payer: Medicare Other | Admitting: Internal Medicine

## 2014-04-18 VITALS — BP 142/80 | HR 85 | Temp 98.1°F | Ht 67.0 in | Wt 150.4 lb

## 2014-04-18 DIAGNOSIS — F32A Depression, unspecified: Secondary | ICD-10-CM

## 2014-04-18 DIAGNOSIS — G4700 Insomnia, unspecified: Secondary | ICD-10-CM | POA: Diagnosis not present

## 2014-04-18 DIAGNOSIS — E039 Hypothyroidism, unspecified: Secondary | ICD-10-CM

## 2014-04-18 DIAGNOSIS — F329 Major depressive disorder, single episode, unspecified: Secondary | ICD-10-CM

## 2014-04-18 DIAGNOSIS — F3289 Other specified depressive episodes: Secondary | ICD-10-CM

## 2014-04-18 DIAGNOSIS — I10 Essential (primary) hypertension: Secondary | ICD-10-CM

## 2014-04-18 MED ORDER — AMLODIPINE BESYLATE 5 MG PO TABS: 5.0000 mg | ORAL_TABLET | Freq: Every day | ORAL | Status: DC

## 2014-04-18 MED ORDER — LEVOTHYROXINE SODIUM 75 MCG PO TABS: ORAL_TABLET | ORAL | Status: DC

## 2014-04-18 MED ORDER — AMITRIPTYLINE HCL 75 MG PO TABS: 75.0000 mg | ORAL_TABLET | Freq: Every day | ORAL | Status: DC

## 2014-04-18 NOTE — Progress Notes (Signed)
Patient ID: Ricardo Alvarez, male   DOB: [birth year], 80 y.o.   MRN: [id number]    No Known Allergies  Chief Complaint  Patient presents with  . Medical Management of Chronic Issues    4 month follow-up, no recent labs    HPI 80 y/o male patient is here for follow up. He has hypothyroidism and HTN and is complaint with his medications. He needs refills on his medications. He is sleeping well at night. No concerns this visit  No recent labs He is very hard of hearing and has hearing aid.   Review of Systems   Constitutional: Negative for fever, chills, activity change, appetite change and fatigue.   HENT: Negative for congestion.    Eyes: Negative for discharge.   Respiratory: Negative for chest tightness and shortness of breath.    Cardiovascular: Negative for chest pain, palpitations and leg swelling.   Gastrointestinal: Negative for abdominal distention.   Genitourinary: Negative for dysuria.   Musculoskeletal: Positive for arthralgia Neurological: Negative for dizziness, syncope and speech difficulty.   Psychiatric/Behavioral: Negative for behavioral problems and agitation. Trazodone and amitryptyline have been helpful and he uses them on as needed basis. He does not want them weaned  Past Medical History  Diagnosis Date  . Essential hypertension, benign   . Major depressive disorder, single episode, unspecified   . Dermatophytosis of foot   . Nausea alone   . Insomnia due to medical condition classified elsewhere   . Chronic kidney disease, unspecified   . Unspecified hypothyroidism   . Other premature beats   . Hypertrophy of prostate with urinary obstruction and other lower urinary tract symptoms (LUTS)   . Nonspecific abnormal electrocardiogram (ECG) (EKG)   . Hypertrophy of prostate without urinary obstruction and other lower urinary tract symptoms (LUTS)   . Nonspecific abnormal electrocardiogram (ECG) (EKG)   . Hypertrophy of prostate without urinary obstruction  and other lower urinary tract symptoms (LUTS)   . Myalgia and myositis, unspecified   . Other malaise and fatigue   . Encounter for long-term (current) use of other medications   . Special screening for malignant neoplasm of prostate   . Dyspepsia and other specified disorders of function of stomach   . Chest pain, unspecified   . Chronic ulcer of other specified site   . Diarrhea   . Insomnia, unspecified    Past Surgical History  Procedure Laterality Date  . Prostate surgery      5 times Dr Retia Passe  . Hernia repair    . Cataract extraction, bilateral    . Appendectomy    . Nose surgery    . Colonoscopy  2004    Dr.Patterson, Diverticulosis    Current Outpatient Prescriptions on File Prior to Visit  Medication Sig Dispense Refill  . calcium carbonate (TUMS LASTING EFFECTS) 500 MG chewable tablet Chew 1 tablet by mouth daily. Take one tablet at bedtime      . traZODone (DESYREL) 150 MG tablet Take 1 tablet (150 mg total) by mouth at bedtime. to help with sleep  90 tablet  3   No current facility-administered medications on file prior to visit.   Family History  Problem Relation Age of Onset  . Pneumonia Mother   . Stroke Father   . Emphysema Brother   . Cancer Brother   . Heart disease Sister    History   Social History  . Marital Status: Single    Spouse Name: N/A    Number  of Children: N/A  . Years of Education: N/A   Occupational History  . Not on file.   Social History Main Topics  . Smoking status: Former Games developer  . Smokeless tobacco: Not on file     Comment: Quit age 36   . Alcohol Use: No  . Drug Use: No  . Sexual Activity: No   Other Topics Concern  . Not on file   Social History Narrative   Dentist-Dr.George Hartley [facility]   Urology- Dr.John Annabell Howells   Surgeon- Dr.John Elige Radon    Physical exam BP 142/80  Pulse 85  Temp(Src) 98.1 F (36.7 C) (Oral)  Ht  (1.702 m)  Wt 150 lb 6.4 oz (68.221 kg)  BMI 23.55 kg/m2   SpO2 98%  Constitutional: He is oriented to person, place, and time. He appears well-developed and well-nourished. No distress.   HENT:   Head: Normocephalic and atraumatic.   Eyes: Conjunctivae are normal. Pupils are equal, round, and reactive to light.   Neck: Normal range of motion. Neck supple. No JVD present.   Cardiovascular: controlled heart rate, no murmur Pulmonary/Chest: Effort normal and breath sounds normal.   Abdominal: Soft. Bowel sounds are normal. There is no guarding.  Musculoskeletal: Normal range of motion. He exhibits no tenderness. No edema Lymphadenopathy:    He has no cervical adenopathy.  Neurological: He is alert and oriented to person, place, and time.   Skin: Skin is warm and dry skin. No rash noted. He is not diaphoretic. No pallor.  Psychiatric: He has a normal mood and affect.   Lab Results  Component Value Date   TSH 1.810 12/20/2013   Lipid Panel  No results found for this basename: chol, trig, hdl, cholhdl, vldl, ldlcalc   Lab Results  Component Value Date   WBC 8.3 06/07/2013   HGB 14.6 06/07/2013   HCT 42.7 06/07/2013   MCV 91 06/07/2013   CMP     Component Value Date/Time   NA 138 12/20/2013 1210   K 5.0 12/20/2013 1210   CL 99 12/20/2013 1210   CO2 23 12/20/2013 1210   GLUCOSE 94 12/20/2013 1210   BUN 21 12/20/2013 1210   CREATININE 1.77* 12/20/2013 1210   CALCIUM 9.0 12/20/2013 1210   PROT 6.7 06/07/2013 1354   AST 18 06/07/2013 1354   ALT 12 06/07/2013 1354   ALKPHOS 74 06/07/2013 1354   BILITOT 0.4 06/07/2013 1354   GFRNONAA 32* 12/20/2013 1210   GFRAA 37* 12/20/2013 1210   Assessment/plan  1. Essential hypertension Stable, continue amlodipine 5 mg daily - CBC with Differential - CMP - Lipid Panel - TSH  2. Hypothyroidism, unspecified hypothyroidism type Continue levothyroxine 75 mcg daily, check tsh - TSH  3. Depression Overall stable with prn amitriptyline for now, monitor  4. Insomnia Continue trazodone   Due for  complete physical next visit

## 2014-04-19 LAB — COMPREHENSIVE METABOLIC PANEL
ALT: 9 IU/L (ref 0–44)
AST: 13 IU/L (ref 0–40)
Albumin/Globulin Ratio: 1.9 (ref 1.1–2.5)
Albumin: 4.2 g/dL (ref 3.2–4.6)
Alkaline Phosphatase: 76 IU/L (ref 39–117)
BUN/Creatinine Ratio: 10 (ref 10–22)
BUN: 18 mg/dL (ref 10–36)
CHLORIDE: 96 mmol/L — AB (ref 97–108)
CO2: 22 mmol/L (ref 18–29)
Calcium: 9.2 mg/dL (ref 8.6–10.2)
Creatinine, Ser: 1.74 mg/dL — ABNORMAL HIGH (ref 0.76–1.27)
GFR calc Af Amer: 38 mL/min/{1.73_m2} — ABNORMAL LOW (ref >59)
GFR calc non Af Amer: 33 mL/min/{1.73_m2} — ABNORMAL LOW (ref >59)
Globulin, Total: 2.2 g/dL (ref 1.5–4.5)
Glucose: 116 mg/dL — ABNORMAL HIGH (ref 65–99)
Potassium: 4.4 mmol/L (ref 3.5–5.2)
SODIUM: 137 mmol/L (ref 134–144)
Total Bilirubin: 0.3 mg/dL (ref 0.0–1.2)
Total Protein: 6.4 g/dL (ref 6.0–8.5)

## 2014-04-19 LAB — CBC WITH DIFFERENTIAL/PLATELET
Basophils Absolute: 0 10*3/uL (ref 0.0–0.2)
Basos: 0 %
Eos: 7 %
Eosinophils Absolute: 0.6 10*3/uL — ABNORMAL HIGH (ref 0.0–0.4)
HCT: 39.3 % (ref 37.5–51.0)
Hemoglobin: 13.5 g/dL (ref 12.6–17.7)
IMMATURE GRANS (ABS): 0 10*3/uL (ref 0.0–0.1)
IMMATURE GRANULOCYTES: 0 %
Lymphocytes Absolute: 1.8 10*3/uL (ref 0.7–3.1)
Lymphs: 19 %
MCH: 31 pg (ref 26.6–33.0)
MCHC: 34.4 g/dL (ref 31.5–35.7)
MCV: 90 fL (ref 79–97)
MONOCYTES: 9 %
MONOS ABS: 0.8 10*3/uL (ref 0.1–0.9)
NEUTROS PCT: 65 %
Neutrophils Absolute: 6 10*3/uL (ref 1.4–7.0)
RBC: 4.36 x10E6/uL (ref 4.14–5.80)
RDW: 14 % (ref 12.3–15.4)
WBC: 9.2 10*3/uL (ref 3.4–10.8)

## 2014-04-19 LAB — LIPID PANEL
CHOLESTEROL TOTAL: 161 mg/dL (ref 100–199)
Chol/HDL Ratio: 3.1 ratio units (ref 0.0–5.0)
HDL: 52 mg/dL (ref >39)
LDL Calculated: 90 mg/dL (ref 0–99)
TRIGLYCERIDES: 93 mg/dL (ref 0–149)
VLDL Cholesterol Cal: 19 mg/dL (ref 5–40)

## 2014-04-19 LAB — TSH: TSH: 1.29 u[IU]/mL (ref 0.450–4.500)

## 2014-04-20 ENCOUNTER — Encounter: Payer: Self-pay | Admitting: *Deleted

## 2014-07-10 DIAGNOSIS — H524 Presbyopia: Secondary | ICD-10-CM | POA: Diagnosis not present

## 2014-07-10 DIAGNOSIS — H04123 Dry eye syndrome of bilateral lacrimal glands: Secondary | ICD-10-CM | POA: Diagnosis not present

## 2014-07-10 DIAGNOSIS — H3531 Nonexudative age-related macular degeneration: Secondary | ICD-10-CM | POA: Diagnosis not present

## 2014-07-10 DIAGNOSIS — Z961 Presence of intraocular lens: Secondary | ICD-10-CM | POA: Diagnosis not present

## 2014-08-14 ENCOUNTER — Encounter: Payer: Self-pay | Admitting: Internal Medicine

## 2014-08-14 ENCOUNTER — Ambulatory Visit (INDEPENDENT_AMBULATORY_CARE_PROVIDER_SITE_OTHER): Payer: Medicare Other | Admitting: Internal Medicine

## 2014-08-14 ENCOUNTER — Ambulatory Visit (INDEPENDENT_AMBULATORY_CARE_PROVIDER_SITE_OTHER): Payer: Medicare Other

## 2014-08-14 VITALS — BP 134/82 | HR 68 | Temp 98.1°F | Resp 10 | Ht 67.0 in | Wt 151.0 lb

## 2014-08-14 DIAGNOSIS — G3184 Mild cognitive impairment, so stated: Secondary | ICD-10-CM | POA: Diagnosis not present

## 2014-08-14 DIAGNOSIS — B351 Tinea unguium: Secondary | ICD-10-CM | POA: Diagnosis not present

## 2014-08-14 DIAGNOSIS — Z0000 Encounter for general adult medical examination without abnormal findings: Secondary | ICD-10-CM | POA: Insufficient documentation

## 2014-08-14 DIAGNOSIS — Z23 Encounter for immunization: Secondary | ICD-10-CM

## 2014-08-14 DIAGNOSIS — G4700 Insomnia, unspecified: Secondary | ICD-10-CM | POA: Diagnosis not present

## 2014-08-14 DIAGNOSIS — E039 Hypothyroidism, unspecified: Secondary | ICD-10-CM

## 2014-08-14 DIAGNOSIS — I10 Essential (primary) hypertension: Secondary | ICD-10-CM | POA: Diagnosis not present

## 2014-08-14 DIAGNOSIS — L309 Dermatitis, unspecified: Secondary | ICD-10-CM | POA: Diagnosis not present

## 2014-08-14 MED ORDER — SUVOREXANT 10 MG PO TABS: 1.0000 | ORAL_TABLET | Freq: Every day | ORAL | Status: DC

## 2014-08-14 MED ORDER — LEVOTHYROXINE SODIUM 75 MCG PO TABS: ORAL_TABLET | ORAL | Status: DC

## 2014-08-14 MED ORDER — AMLODIPINE BESYLATE 5 MG PO TABS
5.0000 mg | ORAL_TABLET | Freq: Every day | ORAL | Status: DC
Start: 2014-08-14 — End: 2015-05-21

## 2014-08-14 MED ORDER — TRAZODONE HCL 150 MG PO TABS: 150.0000 mg | ORAL_TABLET | Freq: Every day | ORAL | Status: DC

## 2014-08-14 NOTE — Progress Notes (Signed)
Patient ID: Ricardo Alvarez, male   DOB: May 03, 1916, 80 y.o.   MRN: [id number]    Chief Complaint  Patient presents with  . Annual Exam    Yearly check-up, last labs August 2015, last EKG October 2014.   . Immunizations    Flu vaccine    No Known Allergies  HPI 80 y/o pleasant elderly male patient is here for his annual exam. He is very hard of hearing, has hearing aids in both his ears. He complaints of not being able to sleep- he is using trazodone and prn amitriptyline for now. He is taking his bp meds and thyroid medications. Denies any other concerns.   Review of Systems  Constitutional: Negative for fever, chills, weight loss, malaise/fatigue and diaphoresis.  HENT: Negative for congestion, sore throat.   Eyes: Negative for blurred vision, double vision and discharge. wears glasses Respiratory: Negative for cough, shortness of breath and wheezing.   Cardiovascular: Negative for chest pain, palpitations, leg swelling.  Gastrointestinal: Negative for heartburn, nausea, vomiting, abdominal pain, diarrhea and constipation.  Genitourinary: Negative for dysuria, urgency, frequency and flank pain.  Musculoskeletal: Negative for back pain, falls Skin: Negative for itching and rash.  Neurological:  Negative for dizziness, tingling, focal weakness and headaches.  Psychiatric/Behavioral: Negative for depression    Past Medical History  Diagnosis Date  . Essential hypertension, benign   . Major depressive disorder, single episode, unspecified   . Dermatophytosis of foot   . Nausea alone   . Insomnia due to medical condition classified elsewhere   . Chronic kidney disease, unspecified   . Unspecified hypothyroidism   . Other premature beats   . Hypertrophy of prostate with urinary obstruction and other lower urinary tract symptoms (LUTS)   . Nonspecific abnormal electrocardiogram (ECG) (EKG)   . Hypertrophy of prostate without urinary obstruction and other lower urinary tract  symptoms (LUTS)   . Nonspecific abnormal electrocardiogram (ECG) (EKG)   . Hypertrophy of prostate without urinary obstruction and other lower urinary tract symptoms (LUTS)   . Myalgia and myositis, unspecified   . Other malaise and fatigue   . Encounter for long-term (current) use of other medications   . Special screening for malignant neoplasm of prostate   . Dyspepsia and other specified disorders of function of stomach   . Chest pain, unspecified   . Chronic ulcer of other specified site   . Diarrhea   . Insomnia, unspecified    Past Surgical History  Procedure Laterality Date  . Prostate surgery      5 times Dr Retia PasseJohn Bradley  . Hernia repair    . Cataract extraction, bilateral    . Appendectomy    . Nose surgery    . Colonoscopy  2004    Dr.Patterson, Diverticulosis    Current Outpatient Prescriptions on File Prior to Visit  Medication Sig Dispense Refill  . amitriptyline (ELAVIL) 75 MG tablet Take 1 tablet (75 mg total) by mouth at bedtime. Take one tablet at bedtime 90 tablet 1  . amLODipine (NORVASC) 5 MG tablet Take 1 tablet (5 mg total) by mouth daily. 90 tablet 1  . calcium carbonate (TUMS LASTING EFFECTS) 500 MG chewable tablet Chew 1 tablet by mouth daily. Take one tablet at bedtime    . levothyroxine (SYNTHROID, LEVOTHROID) 75 MCG tablet Take 1 tablet by mouth daily for Hypothyroidism 90 tablet 0  . traZODone (DESYREL) 150 MG tablet Take 1 tablet (150 mg total) by mouth at bedtime. to help with sleep  90 tablet 3   No current facility-administered medications on file prior to visit.   Family History  Problem Relation Age of Onset  . Pneumonia Mother   . Stroke Father   . Emphysema Brother   . Cancer Brother   . Heart disease Sister    History   Social History  . Marital Status: Single    Spouse Name: N/A    Number of Children: N/A  . Years of Education: N/A   Occupational History  . Not on file.   Social History Main Topics  . Smoking status: Former  Games developer  . Smokeless tobacco: Not on file     Comment: Quit age 89   . Alcohol Use: No  . Drug Use: No  . Sexual Activity: No   Other Topics Concern  . Not on file   Social History Narrative   Dentist-Dr.George Lorelle [place]   [facility]   Urology- Dr.John Annabell Howells   Surgeon- Dr.John Elige Radon    Physical exam BP 134/82 mmHg  Pulse 68  Temp(Src) 98.1 F (36.7 C) (Oral)  Resp 10  Ht 5\' 7"  (1.702 m)  Wt 151 lb (68.493 kg)  BMI 23.64 kg/m2  SpO2 91%  General- elderly male in no acute distress Head- atraumatic, normocephalic Eyes- PERRLA, EOMI, no pallor, no icterus, no discharge Ears- left ear normal tympanic membrane and normal external ear canal , right ear normal tympanic membrane and normal external ear canal Neck- no lymphadenopathy, no thyromegaly, no jugular vein distension, no carotid bruit Nose- normal nasal mucosa, no maxillary sinus tenderness, no frontal sinus tenderness Mouth- normal mucus membrane, no oral thrush, normal oropharynx, has upper partial dentures Chest- no chest wall deformities, no chest wall tenderness Cardiovascular- normal s1,s2, no murmurs, normal distal pulses Respiratory- bilateral clear to auscultation, no wheeze, no rhonchi, no crackles Abdomen- bowel sounds present, soft, non tender, no CVA tenderness Musculoskeletal- able to move all 4 extremities, no use of assistive device, normal range of motion, no leg edema Neurological- no focal deficit, normal reflexes, normal muscle strength, normal sensation to fine touch and vibration Skin- warm and excessively dry, has calluses and hypertrophied nails with onychomycosis Psychiatry- alert and oriented to person, place and time, normal mood and affect   Lab Results  Component Value Date   TSH 1.290 04/18/2014   CBC Latest Ref Rng 04/18/2014 06/07/2013  WBC 3.4 - 10.8 x10E3/uL 9.2 8.3  Hemoglobin 12.6 - 17.7 g/dL 40.9 81.1  Hematocrit 91.4 - 51.0 % 39.3 42.7    CMP     Component  Value Date/Time   NA 137 04/18/2014 1434   K 4.4 04/18/2014 1434   CL 96* 04/18/2014 1434   CO2 22 04/18/2014 1434   GLUCOSE 116* 04/18/2014 1434   BUN 18 04/18/2014 1434   CREATININE 1.74* 04/18/2014 1434   CALCIUM 9.2 04/18/2014 1434   PROT 6.4 04/18/2014 1434   AST 13 04/18/2014 1434   ALT 9 04/18/2014 1434   ALKPHOS 76 04/18/2014 1434   BILITOT 0.3 04/18/2014 1434   GFRNONAA 33* 04/18/2014 1434   GFRAA 38* 04/18/2014 1434   Lipid Panel     Component Value Date/Time   TRIG 93 04/18/2014 1434   HDL 52 04/18/2014 1434   CHOLHDL 3.1 04/18/2014 1434   LDLCALC 90 04/18/2014 1434   08/14/14 MMSE  27/30, passed clock draw  Assessment/plan  1. Insomnia Start belsomra 10 mg daily, sample provided. D/c amitriptyline and continue trazodone, reassess if no improvement  2. Essential  hypertension Stable, continue norvasc 5 mg daily   3. Hypothyroidism, unspecified hypothyroidism type Stable, continue levothyroxine 75 mcg daily for now  4. Eczema Not using moisturizer, advised on using emmolient like vaseline  5. Mild cognitive impairment Likely has vascular component contributing to this, monitor clinically, independent with his ADLs. Monitor clinically  6. Onychomycosis Refuses podiatry referral, refuses treatment for it at present  7. Routine medical exam the patient was counseled regarding prevention of dental and periodontal disease, diet, routine lab work and remaining active. Monitor clinically

## 2014-08-14 NOTE — Progress Notes (Signed)
Passed clock drawing 

## 2014-10-16 ENCOUNTER — Encounter: Payer: Self-pay | Admitting: Internal Medicine

## 2015-01-14 ENCOUNTER — Ambulatory Visit (INDEPENDENT_AMBULATORY_CARE_PROVIDER_SITE_OTHER): Payer: Medicare Other | Admitting: Nurse Practitioner

## 2015-01-14 VITALS — BP 138/78 | HR 91 | Temp 97.4°F | Resp 20 | Ht 67.0 in | Wt 151.4 lb

## 2015-01-14 DIAGNOSIS — N189 Chronic kidney disease, unspecified: Secondary | ICD-10-CM | POA: Diagnosis not present

## 2015-01-14 DIAGNOSIS — E039 Hypothyroidism, unspecified: Secondary | ICD-10-CM

## 2015-01-14 DIAGNOSIS — I10 Essential (primary) hypertension: Secondary | ICD-10-CM | POA: Diagnosis not present

## 2015-01-14 DIAGNOSIS — G4700 Insomnia, unspecified: Secondary | ICD-10-CM | POA: Diagnosis not present

## 2015-01-14 DIAGNOSIS — L309 Dermatitis, unspecified: Secondary | ICD-10-CM | POA: Diagnosis not present

## 2015-01-14 MED ORDER — AMITRIPTYLINE HCL 75 MG PO TABS: 75.0000 mg | ORAL_TABLET | Freq: Every day | ORAL | Status: DC

## 2015-01-14 NOTE — Patient Instructions (Signed)
Use EUCERIN lotion to body twice daily this will help itch Increase water intake

## 2015-01-14 NOTE — Addendum Note (Signed)
Addended by: Sharon SellerEUBANKS, JESSICA K on: 01/14/2015 01:28 PM   Modules accepted: Orders

## 2015-01-14 NOTE — Progress Notes (Signed)
Patient ID: Ricardo Alvarez, male   DOB: 19-Jul-1916, 81 y.o.   MRN: [id number]    PCP: Sharon SellerEUBANKS, JESSICA K, NP  No Known Allergies  Chief Complaint  Patient presents with  . Medical Management of Chronic Issues     HPI: Patient is a 81 y.o. male seen in the office today for routine follow up on chronic conditions. Pt with a pmh of insomnia, htn, hypothyroidism, mild cognitive impairment, CKD.   Pt reports major issue is insomnia, did not find good benefit with belsomra so he went back to amitriptyline and trazodone.  conts with dry itchy skin, not applying moisturizer  Pt conts to drive, takes care of daily affairs, family does not live close by  Review of Systems:  Review of Systems  Constitutional: Negative for activity change, appetite change, fatigue and unexpected weight change.  HENT: Negative for congestion and hearing loss.   Eyes: Negative.   Respiratory: Negative for cough and shortness of breath.   Cardiovascular: Negative for chest pain, palpitations and leg swelling.  Gastrointestinal: Negative for abdominal pain, diarrhea and constipation.  Genitourinary: Negative for dysuria and difficulty urinating.  Musculoskeletal: Negative for myalgias and arthralgias.  Skin: Negative for color change and wound.       Dry skin  Neurological: Negative for dizziness and weakness.  Psychiatric/Behavioral: Positive for sleep disturbance. Negative for behavioral problems, confusion and agitation.    Past Medical History  Diagnosis Date  . Essential hypertension, benign   . Major depressive disorder, single episode, unspecified   . Dermatophytosis of foot   . Nausea alone   . Insomnia due to medical condition classified elsewhere   . Chronic kidney disease, unspecified   . Unspecified hypothyroidism   . Other premature beats   . Hypertrophy of prostate with urinary obstruction and other lower urinary tract symptoms (LUTS)   . Nonspecific abnormal electrocardiogram (ECG)  (EKG)   . Hypertrophy of prostate without urinary obstruction and other lower urinary tract symptoms (LUTS)   . Nonspecific abnormal electrocardiogram (ECG) (EKG)   . Hypertrophy of prostate without urinary obstruction and other lower urinary tract symptoms (LUTS)   . Myalgia and myositis, unspecified   . Other malaise and fatigue   . Encounter for long-term (current) use of other medications   . Special screening for malignant neoplasm of prostate   . Dyspepsia and other specified disorders of function of stomach   . Chest pain, unspecified   . Chronic ulcer of other specified site   . Diarrhea   . Insomnia, unspecified    Past Surgical History  Procedure Laterality Date  . Prostate surgery      5 times Dr Retia PasseJohn Bradley  . Hernia repair    . Cataract extraction, bilateral    . Appendectomy    . Nose surgery    . Colonoscopy  2004    Dr.Patterson, Diverticulosis    Social History:   reports that he has quit smoking. He does not have any smokeless tobacco history on file. He reports that he does not drink alcohol or use illicit drugs.  Family History  Problem Relation Age of Onset  . Pneumonia Mother   . Stroke Father   . Emphysema Brother   . Cancer Brother   . Heart disease Sister     Medications: Patient's Medications  New Prescriptions   No medications on file  Previous Medications   AMITRIPTYLINE (ELAVIL) 75 MG TABLET    Take 1 tablet (75 mg total) by  mouth at bedtime. Take one tablet at bedtime   AMLODIPINE (NORVASC) 5 MG TABLET    Take 1 tablet (5 mg total) by mouth daily.   CALCIUM CARBONATE (TUMS LASTING EFFECTS) 500 MG CHEWABLE TABLET    Chew 1 tablet by mouth daily. Take one tablet at bedtime   LEVOTHYROXINE (SYNTHROID, LEVOTHROID) 75 MCG TABLET    Take 1 tablet by mouth daily for Hypothyroidism   TRAZODONE (DESYREL) 150 MG TABLET    Take 1 tablet (150 mg total) by mouth at bedtime. to help with sleep  Modified Medications   No medications on file    Discontinued Medications   SUVOREXANT (BELSOMRA) 10 MG TABS    Take 1 tablet by mouth daily.     Physical Exam:  Filed Vitals:   01/14/15 1257  BP: 138/78  Pulse: 91  Temp: 97.4 F (36.3 C)  TempSrc: Oral  Resp: 20  Height:  (1.702 m)  Weight: 151 lb 6.4 oz (68.675 kg)  SpO2: 92%    Physical Exam  Constitutional: He is oriented to person, place, and time. He appears well-developed and well-nourished. No distress.  HENT:  Head: Normocephalic and atraumatic.  Mouth/Throat: Oropharynx is clear and moist. No oropharyngeal exudate.  Eyes: Conjunctivae and EOM are normal. Pupils are equal, round, and reactive to light.  Neck: Normal range of motion. Neck supple.  Cardiovascular: Normal rate, regular rhythm and normal heart sounds.   Pulmonary/Chest: Effort normal and breath sounds normal.  Abdominal: Soft. Bowel sounds are normal.  Musculoskeletal: He exhibits no edema or tenderness.  Neurological: He is alert and oriented to person, place, and time.  Skin: Skin is warm and dry. He is not diaphoretic.  Dry flaky skin to abdomen, arms and legs   Psychiatric: He has a normal mood and affect.    Labs reviewed: Basic Metabolic Panel:  Recent Labs  96/04/54 1434  NA 137  K 4.4  CL 96*  CO2 22  GLUCOSE 116*  BUN 18  CREATININE 1.74*  CALCIUM 9.2  TSH 1.290   Liver Function Tests:  Recent Labs  04/18/14 1434  AST 13  ALT 9  ALKPHOS 76  BILITOT 0.3  PROT 6.4   No results for input(s): LIPASE, AMYLASE in the last 8760 hours. No results for input(s): AMMONIA in the last 8760 hours. CBC:  Recent Labs  04/18/14 1434  WBC 9.2  NEUTROABS 6.0  HGB 13.5  HCT 39.3  MCV 90   Lipid Panel:  Recent Labs  04/18/14 1434  CHOL 161  HDL 52  LDLCALC 90  TRIG 93  CHOLHDL 3.1   TSH:  Recent Labs  04/18/14 1434  TSH 1.290   A1C: No results found for: HGBA1C   Assessment/Plan 1. Hypothyroidism, unspecified hypothyroidism type continues on  synthroid 75 mcg  - will follow up TSH  2. Essential hypertension -controlled on Norvasc - Basic metabolic panel  3. Insomnia -will cont current regimen which seems to be maintaining pt adequately   4. Eczema -not using moisturizer -to take Eucerin lotion twice daily and increase water intake  5. CKD -will follow up BMP  Follow up in 4 months, sooner if needed

## 2015-01-15 ENCOUNTER — Ambulatory Visit: Payer: Medicare Other | Admitting: Internal Medicine

## 2015-01-15 ENCOUNTER — Encounter: Payer: Self-pay | Admitting: *Deleted

## 2015-01-15 LAB — BASIC METABOLIC PANEL
BUN / CREAT RATIO: 15 (ref 10–22)
BUN: 25 mg/dL (ref 10–36)
CALCIUM: 9.3 mg/dL (ref 8.6–10.2)
CHLORIDE: 101 mmol/L (ref 97–108)
CO2: 25 mmol/L (ref 18–29)
Creatinine, Ser: 1.68 mg/dL — ABNORMAL HIGH (ref 0.76–1.27)
GFR calc Af Amer: 39 mL/min/{1.73_m2} — ABNORMAL LOW (ref >59)
GFR calc non Af Amer: 34 mL/min/{1.73_m2} — ABNORMAL LOW (ref >59)
GLUCOSE: 114 mg/dL — AB (ref 65–99)
POTASSIUM: 4.7 mmol/L (ref 3.5–5.2)
Sodium: 141 mmol/L (ref 134–144)

## 2015-01-15 LAB — TSH: TSH: 0.642 u[IU]/mL (ref 0.450–4.500)

## 2015-05-21 ENCOUNTER — Ambulatory Visit (INDEPENDENT_AMBULATORY_CARE_PROVIDER_SITE_OTHER): Payer: Medicare Other | Admitting: Nurse Practitioner

## 2015-05-21 ENCOUNTER — Encounter: Payer: Self-pay | Admitting: Nurse Practitioner

## 2015-05-21 VITALS — BP 146/84 | HR 90 | Temp 98.1°F | Ht 67.0 in | Wt 151.4 lb

## 2015-05-21 DIAGNOSIS — N189 Chronic kidney disease, unspecified: Secondary | ICD-10-CM

## 2015-05-21 DIAGNOSIS — L299 Pruritus, unspecified: Secondary | ICD-10-CM | POA: Diagnosis not present

## 2015-05-21 DIAGNOSIS — E039 Hypothyroidism, unspecified: Secondary | ICD-10-CM | POA: Diagnosis not present

## 2015-05-21 DIAGNOSIS — G4700 Insomnia, unspecified: Secondary | ICD-10-CM | POA: Diagnosis not present

## 2015-05-21 DIAGNOSIS — I10 Essential (primary) hypertension: Secondary | ICD-10-CM | POA: Diagnosis not present

## 2015-05-21 MED ORDER — AMLODIPINE BESYLATE 5 MG PO TABS: 5.0000 mg | ORAL_TABLET | Freq: Every day | ORAL | Status: DC

## 2015-05-21 MED ORDER — HYDROXYZINE HCL 10 MG PO TABS: ORAL_TABLET | ORAL | Status: DC

## 2015-05-21 MED ORDER — LEVOTHYROXINE SODIUM 75 MCG PO TABS: ORAL_TABLET | ORAL | Status: DC

## 2015-05-21 NOTE — Progress Notes (Signed)
Patient ID: Ricardo Alvarez, male   DOB: October 17, 1915, 81 y.o.   MRN: [id number]    PCP: Sharon Seller, NP  Advanced Directive information    No Known Allergies  Chief Complaint  Patient presents with  . Medical Management of Chronic Issues    Medical Management of Chronic Issues. 4 Month follow up     HPI: Patient is a 81 y.o. male seen in the office today for routine follow up on chronic conditions.  Pt with a pmh of insomnia, htn, hypothyroidism, mild cognitive impairment, CKD.    Insomnia controlled on medication. Reports he does not have much of an appetite but maintaining weight.  No falls in the last year.  No illness or hospitalization since last visit  Reports he used lotion but still itching. Sometimes worse than others No rash  Review of Systems:  Review of Systems  Constitutional: Negative for activity change, appetite change, fatigue and unexpected weight change.  HENT: Negative for congestion and hearing loss.   Eyes: Negative.   Respiratory: Negative for cough and shortness of breath.   Cardiovascular: Negative for chest pain, palpitations and leg swelling.  Gastrointestinal: Negative for abdominal pain, diarrhea and constipation.  Genitourinary: Negative for dysuria and difficulty urinating.  Musculoskeletal: Negative for myalgias and arthralgias.  Skin: Negative for color change and wound.       Dry skin-itching  Neurological: Negative for dizziness and weakness.  Psychiatric/Behavioral: Positive for sleep disturbance. Negative for behavioral problems, confusion and agitation.    Past Medical History  Diagnosis Date  . Essential hypertension, benign   . Major depressive disorder, single episode, unspecified   . Dermatophytosis of foot   . Nausea alone   . Insomnia due to medical condition classified elsewhere   . Chronic kidney disease, unspecified   . Unspecified hypothyroidism   . Other premature beats   . Hypertrophy of prostate with  urinary obstruction and other lower urinary tract symptoms (LUTS)   . Nonspecific abnormal electrocardiogram (ECG) (EKG)   . Hypertrophy of prostate without urinary obstruction and other lower urinary tract symptoms (LUTS)   . Nonspecific abnormal electrocardiogram (ECG) (EKG)   . Hypertrophy of prostate without urinary obstruction and other lower urinary tract symptoms (LUTS)   . Myalgia and myositis, unspecified   . Other malaise and fatigue   . Encounter for long-term (current) use of other medications   . Special screening for malignant neoplasm of prostate   . Dyspepsia and other specified disorders of function of stomach   . Chest pain, unspecified   . Chronic ulcer of other specified site   . Diarrhea   . Insomnia, unspecified    Past Surgical History  Procedure Laterality Date  . Prostate surgery      5 times Dr Retia Passe  . Hernia repair    . Cataract extraction, bilateral    . Appendectomy    . Nose surgery    . Colonoscopy  2004    Dr.Patterson, Diverticulosis    Social History:   reports that he has quit smoking. He does not have any smokeless tobacco history on file. He reports that he does not drink alcohol or use illicit drugs.  Family History  Problem Relation Age of Onset  . Pneumonia Mother   . Stroke Father   . Emphysema Brother   . Cancer Brother   . Heart disease Sister     Medications: Patient's Medications  New Prescriptions   No medications on file  Previous Medications   AMITRIPTYLINE (ELAVIL) 75 MG TABLET    Take 1 tablet (75 mg total) by mouth at bedtime. Take one tablet at bedtime   AMLODIPINE (NORVASC) 5 MG TABLET    Take 1 tablet (5 mg total) by mouth daily.   CALCIUM CARBONATE (TUMS LASTING EFFECTS) 500 MG CHEWABLE TABLET    Chew 1 tablet by mouth daily. Take one tablet at bedtime   LEVOTHYROXINE (SYNTHROID, LEVOTHROID) 75 MCG TABLET    Take 1 tablet by mouth daily for Hypothyroidism   TRAZODONE (DESYREL) 150 MG TABLET    Take 1 tablet  (150 mg total) by mouth at bedtime. to help with sleep  Modified Medications   No medications on file  Discontinued Medications   No medications on file     Physical Exam:  Filed Vitals:   05/21/15 1255  BP: 146/84  Pulse: 90  Temp: 98.1 F (36.7 C)  TempSrc: Oral  Height:  (1.702 m)  Weight: 151 lb 6.4 oz (68.675 kg)   Body mass index is 23.71 kg/(m^2).  Physical Exam  Constitutional: He is oriented to person, place, and time. He appears well-developed and well-nourished. No distress.  HENT:  Head: Normocephalic and atraumatic.  Mouth/Throat: Oropharynx is clear and moist. No oropharyngeal exudate.  Eyes: Conjunctivae and EOM are normal. Pupils are equal, round, and reactive to light.  Neck: Normal range of motion. Neck supple.  Cardiovascular: Normal rate, regular rhythm and normal heart sounds.   Pulmonary/Chest: Effort normal and breath sounds normal.  Abdominal: Soft. Bowel sounds are normal.  Musculoskeletal: He exhibits no edema or tenderness.  Neurological: He is alert and oriented to person, place, and time.  Skin: Skin is warm and dry. He is not diaphoretic.  Dry flaky skin to abdomen, arms and legs   Psychiatric: He has a normal mood and affect.    Labs reviewed: Basic Metabolic Panel:  Recent Labs  40/98/11 1336  NA 141  K 4.7  CL 101  CO2 25  GLUCOSE 114*  BUN 25  CREATININE 1.68*  CALCIUM 9.3  TSH 0.642   Liver Function Tests: No results for input(s): AST, ALT, ALKPHOS, BILITOT, PROT, ALBUMIN in the last 8760 hours. No results for input(s): LIPASE, AMYLASE in the last 8760 hours. No results for input(s): AMMONIA in the last 8760 hours. CBC: No results for input(s): WBC, NEUTROABS, HGB, HCT, MCV, PLT in the last 8760 hours. Lipid Panel: No results for input(s): CHOL, HDL, LDLCALC, TRIG, CHOLHDL, LDLDIRECT in the last 8760 hours. TSH:  Recent Labs  01/14/15 1336  TSH 0.642   A1C: No results found for:  HGBA1C   Assessment/Plan 1. Essential hypertension Stable, cont norvsac 5 mg daily   2. Insomnia Well controlled on elavil and trazodone  3. CKD (chronic kidney disease), unspecified stage -to avoid NSAIDs, encourage hydration - Comprehensive metabolic panel - CBC with Differential  4. Hypothyroidism, unspecified hypothyroidism type -TSH in therapeutic range in May, to cont synthroid 75 mcg  5. Pruritus of skin -pt reports Eucerin was not effective, encouraged use routinely as well as increase in hydration - hydrOXYzine (ATARAX/VISTARIL) 10 MG tablet; 10 mg by mouth every 8 hours as needed for itching  Dispense: 30 tablet; Refill: 0  To follow up in 3 months for EV with [facility]   Jessica K. Biagio Borg  [facility] [phone] 8 am - 5 pm) [phone] (after hours)

## 2015-05-21 NOTE — Patient Instructions (Addendum)
Hydroxyzine 10 mg by mouth every 8 hours as needed for itch-- prescription sent to pharmacy  Follow up in 3-4 months for physical with MMSE   Will get lab work today

## 2015-05-22 LAB — COMPREHENSIVE METABOLIC PANEL
ALT: 9 IU/L (ref 0–44)
AST: 16 IU/L (ref 0–40)
Albumin/Globulin Ratio: 2.1 (ref 1.1–2.5)
Albumin: 4.4 g/dL (ref 3.2–4.6)
Alkaline Phosphatase: 59 IU/L (ref 39–117)
BUN/Creatinine Ratio: 12 (ref 10–22)
BUN: 23 mg/dL (ref 10–36)
Bilirubin Total: 0.4 mg/dL (ref 0.0–1.2)
CO2: 22 mmol/L (ref 18–29)
CREATININE: 1.93 mg/dL — AB (ref 0.76–1.27)
Calcium: 9.5 mg/dL (ref 8.6–10.2)
Chloride: 97 mmol/L (ref 97–108)
GFR, EST AFRICAN AMERICAN: 33 mL/min/{1.73_m2} — AB (ref >59)
GFR, EST NON AFRICAN AMERICAN: 29 mL/min/{1.73_m2} — AB (ref >59)
Globulin, Total: 2.1 g/dL (ref 1.5–4.5)
Glucose: 110 mg/dL — ABNORMAL HIGH (ref 65–99)
Potassium: 4.9 mmol/L (ref 3.5–5.2)
Sodium: 137 mmol/L (ref 134–144)
Total Protein: 6.5 g/dL (ref 6.0–8.5)

## 2015-05-22 LAB — CBC WITH DIFFERENTIAL/PLATELET
Basophils Absolute: 0 10*3/uL (ref 0.0–0.2)
Basos: 0 %
EOS (ABSOLUTE): 0.1 10*3/uL (ref 0.0–0.4)
EOS: 1 %
HEMOGLOBIN: 14.4 g/dL (ref 12.6–17.7)
Hematocrit: 42.1 % (ref 37.5–51.0)
IMMATURE GRANS (ABS): 0.1 10*3/uL (ref 0.0–0.1)
IMMATURE GRANULOCYTES: 1 %
Lymphocytes Absolute: 1.9 10*3/uL (ref 0.7–3.1)
Lymphs: 22 %
MCH: 30.8 pg (ref 26.6–33.0)
MCHC: 34.2 g/dL (ref 31.5–35.7)
MCV: 90 fL (ref 79–97)
MONOCYTES: 10 %
Monocytes Absolute: 0.8 10*3/uL (ref 0.1–0.9)
NEUTROS PCT: 66 %
Neutrophils Absolute: 5.5 10*3/uL (ref 1.4–7.0)
PLATELETS: 211 10*3/uL (ref 150–379)
RBC: 4.67 x10E6/uL (ref 4.14–5.80)
RDW: 14 % (ref 12.3–15.4)
WBC: 8.4 10*3/uL (ref 3.4–10.8)

## 2015-05-30 ENCOUNTER — Ambulatory Visit (INDEPENDENT_AMBULATORY_CARE_PROVIDER_SITE_OTHER): Payer: Medicare Other | Admitting: Nurse Practitioner

## 2015-05-30 ENCOUNTER — Encounter: Payer: Self-pay | Admitting: Nurse Practitioner

## 2015-05-30 VITALS — BP 130/78 | HR 94 | Temp 99.0°F | Resp 20 | Ht 67.0 in | Wt 151.8 lb

## 2015-05-30 DIAGNOSIS — N189 Chronic kidney disease, unspecified: Secondary | ICD-10-CM

## 2015-05-30 DIAGNOSIS — T148 Other injury of unspecified body region: Secondary | ICD-10-CM | POA: Diagnosis not present

## 2015-05-30 DIAGNOSIS — L299 Pruritus, unspecified: Secondary | ICD-10-CM

## 2015-05-30 DIAGNOSIS — T148XXA Other injury of unspecified body region, initial encounter: Secondary | ICD-10-CM

## 2015-05-30 DIAGNOSIS — R0781 Pleurodynia: Secondary | ICD-10-CM | POA: Diagnosis not present

## 2015-05-30 MED ORDER — TRAMADOL HCL 50 MG PO TABS: 50.0000 mg | ORAL_TABLET | Freq: Four times a day (QID) | ORAL | Status: DC | PRN

## 2015-05-30 MED ORDER — DOXYCYCLINE HYCLATE 100 MG PO TABS: 100.0000 mg | ORAL_TABLET | Freq: Two times a day (BID) | ORAL | Status: DC

## 2015-05-30 NOTE — Progress Notes (Signed)
Patient ID: Ricardo Alvarez, male   DOB: February 09, 1916, 81 y.o.   MRN: [id number]    PCP: Sharon Seller, NP  Advanced Directive information    No Known Allergies  Chief Complaint  Patient presents with  . Acute Visit    ? broken rib(left side) lacerations on both arms     HPI: Patient is a 81 y.o. male seen in the office today due to fall a week ago. Son reports he was worried about infection to left arm. Per son from pts report pt was on old step stool trying to change a light and feel off due to stool being wobbly. Pt removed stool from pts home. Pt had not felt unsteady prior to fall. No side effects noted from vistaril which he was recently prescribed for itch. Pt reports medication does help for itch. Reports he does not bath, but washes off with a wash cloth.  Pt feel on left side and hit ribs and left elbow. Skin tear to left elbow, forearm and right forearm. No pain to bilateral arms, able to move without difficulty.  Since fall has had pain in ribs when moving a certain way or deep breathing.  No fevers or chills.    Review of Systems:  Review of Systems  Constitutional: Negative for fever, activity change, appetite change, fatigue and unexpected weight change.  HENT: Positive for hearing loss.   Respiratory: Negative for cough and shortness of breath.   Cardiovascular: Negative for chest pain and palpitations.  Gastrointestinal: Negative for abdominal pain, diarrhea and constipation.  Musculoskeletal: Positive for myalgias (to ribs). Negative for arthralgias.  Skin: Positive for wound (skin tears). Negative for color change.       Dry skin-itching  Neurological: Negative for dizziness, tremors, speech difficulty, weakness and light-headedness.    Past Medical History  Diagnosis Date  . Essential hypertension, benign   . Major depressive disorder, single episode, unspecified (HCC)   . Dermatophytosis of foot   . Nausea alone   . Insomnia due to medical  condition classified elsewhere   . Chronic kidney disease, unspecified (HCC)   . Unspecified hypothyroidism   . Other premature beats   . Hypertrophy of prostate with urinary obstruction and other lower urinary tract symptoms (LUTS)   . Nonspecific abnormal electrocardiogram (ECG) (EKG)   . Hypertrophy of prostate without urinary obstruction and other lower urinary tract symptoms (LUTS)   . Nonspecific abnormal electrocardiogram (ECG) (EKG)   . Hypertrophy of prostate without urinary obstruction and other lower urinary tract symptoms (LUTS)   . Myalgia and myositis, unspecified   . Other malaise and fatigue   . Encounter for long-term (current) use of other medications   . Special screening for malignant neoplasm of prostate   . Dyspepsia and other specified disorders of function of stomach   . Chest pain, unspecified   . Chronic ulcer of other specified site   . Diarrhea   . Insomnia, unspecified    Past Surgical History  Procedure Laterality Date  . Prostate surgery      5 times Dr Retia Passe  . Hernia repair    . Cataract extraction, bilateral    . Appendectomy    . Nose surgery    . Colonoscopy  2004    Dr.Patterson, Diverticulosis    Social History:   reports that he has quit smoking. He does not have any smokeless tobacco history on file. He reports that he does not drink alcohol or use illicit  drugs.  Family History  Problem Relation Age of Onset  . Pneumonia Mother   . Stroke Father   . Emphysema Brother   . Cancer Brother   . Heart disease Sister     Medications: Patient's Medications  New Prescriptions   No medications on file  Previous Medications   AMITRIPTYLINE (ELAVIL) 75 MG TABLET    Take 1 tablet (75 mg total) by mouth at bedtime. Take one tablet at bedtime   AMLODIPINE (NORVASC) 5 MG TABLET    Take 1 tablet (5 mg total) by mouth daily.   CALCIUM CARBONATE (TUMS LASTING EFFECTS) 500 MG CHEWABLE TABLET    Chew 1 tablet by mouth daily. Take one tablet  at bedtime   HYDROXYZINE (ATARAX/VISTARIL) 10 MG TABLET    10 mg by mouth every 8 hours as needed for itching   LEVOTHYROXINE (SYNTHROID, LEVOTHROID) 75 MCG TABLET    Take 1 tablet by mouth daily for Hypothyroidism   TRAZODONE (DESYREL) 150 MG TABLET    Take 1 tablet (150 mg total) by mouth at bedtime. to help with sleep  Modified Medications   No medications on file  Discontinued Medications   No medications on file     Physical Exam:  Filed Vitals:   05/30/15 1038  BP: 130/78  Pulse: 94  Temp: 99 F (37.2 C)  TempSrc: Oral  Resp: 20  Height: 5\' 7"  (1.702 m)  Weight: 151 lb 12.8 oz (68.856 kg)  SpO2: 94%   Body mass index is 23.77 kg/(m^2).  Physical Exam  Constitutional: He is oriented to person, place, and time. He appears well-developed and well-nourished. No distress.  HENT:  Head: Normocephalic and atraumatic.  Cardiovascular: Normal rate, regular rhythm and normal heart sounds.   Pulmonary/Chest: Effort normal and breath sounds normal.  Tenderness with bruising noted to left lower ribs  Musculoskeletal: He exhibits no edema or tenderness.  Neurological: He is alert and oriented to person, place, and time.  Skin: Skin is warm and dry. He is not diaphoretic.  Dry flaky skin to abdomen, arms and legs  Skin tear to left elbow and forearm Skin tear with minimal amount of purulent drainage to right forearm, mild erythema to surrounding tissue  Psychiatric: He has a normal mood and affect.    Labs reviewed: Basic Metabolic Panel:  Recent Labs  21/30/86 1336 05/21/15 1329  NA 141 137  K 4.7 4.9  CL 101 97  CO2 25 22  GLUCOSE 114* 110*  BUN 25 23  CREATININE 1.68* 1.93*  CALCIUM 9.3 9.5  TSH 0.642  --    Liver Function Tests:  Recent Labs  05/21/15 1329  AST 16  ALT 9  ALKPHOS 59  BILITOT 0.4  PROT 6.5   No results for input(s): LIPASE, AMYLASE in the last 8760 hours. No results for input(s): AMMONIA in the last 8760 hours. CBC:  Recent Labs   05/21/15 1329  WBC 8.4  NEUTROABS 5.5  HCT 42.1   Lipid Panel: No results for input(s): CHOL, HDL, LDLCALC, TRIG, CHOLHDL, LDLDIRECT in the last 8760 hours. TSH:  Recent Labs  01/14/15 1336  TSH 0.642   A1C: No results found for: HGBA1C   Assessment/Plan 1. Multiple skin tears -to bilateral forearms. Left forearm skin tearwith increased odor and drainage when son initially saw it. Cleans area but still with purulent drainage and erythema to surrounding tissue. Instructions given for wound  - doxycycline (VIBRA-TABS) 100 MG tablet; Take 1 tablet (100 mg total) by  mouth 2 (two) times daily.  Dispense: 14 tablet; Refill: 0  2. Rib pain on left side -possible fracture to left side after fall, good breath sounds/  Pt reports pain is not constant but has sharp pain to rib - traMADol (ULTRAM) 50 MG tablet; Take 1 tablet (50 mg total) by mouth every 6 (six) hours as needed for moderate pain (may take 2 tablets as needed for SEVERE pain).  Dispense: 120 tablet; Refill: 1  3. CKD (chronic kidney disease), unspecified stage -worsening renal function. Discussed with son and pt. To increase hydration and avoid ibuprofen, Advil, motrin, etc.  4. Pruritus of skin -pt reports he does not bath due to fear of getting in the shower which could be making itching worse. Encouraged good hydration, use of eucerin lotion twice daily. Good effect with vistaril which he was used sparingly   30 min Time TOTAL:  time greater than 50% of total time spent doing pt counseled and coordination of care regarding wound care and discussing chronic conditions with son.   Janene Harvey. Biagio Borg  [facility] [phone] 8 am - 5 pm) [phone] (after hours)

## 2015-05-30 NOTE — Patient Instructions (Addendum)
To take dressing off and rise well with water tomorrow To use nonstick dressing daily with curlex daily To use neosporin to skin tears daily for 3 days then as needed  To take doxycycline 100 mg twice daily for 1 week for infection  To use tramadol 50 mg by mouth every 6 hours as needed for pain May take 2 tablets for SEVERE pain.

## 2015-08-13 DIAGNOSIS — Z961 Presence of intraocular lens: Secondary | ICD-10-CM | POA: Diagnosis not present

## 2015-08-13 DIAGNOSIS — H353120 Nonexudative age-related macular degeneration, left eye, stage unspecified: Secondary | ICD-10-CM | POA: Diagnosis not present

## 2015-08-13 DIAGNOSIS — H04123 Dry eye syndrome of bilateral lacrimal glands: Secondary | ICD-10-CM | POA: Diagnosis not present

## 2015-08-13 DIAGNOSIS — H353110 Nonexudative age-related macular degeneration, right eye, stage unspecified: Secondary | ICD-10-CM | POA: Diagnosis not present

## 2015-08-20 ENCOUNTER — Encounter: Payer: Self-pay | Admitting: Nurse Practitioner

## 2015-08-20 ENCOUNTER — Ambulatory Visit (INDEPENDENT_AMBULATORY_CARE_PROVIDER_SITE_OTHER): Payer: Medicare Other | Admitting: Nurse Practitioner

## 2015-08-20 VITALS — BP 122/76 | HR 92 | Temp 98.0°F | Resp 20 | Ht 67.0 in | Wt 145.4 lb

## 2015-08-20 DIAGNOSIS — F329 Major depressive disorder, single episode, unspecified: Secondary | ICD-10-CM

## 2015-08-20 DIAGNOSIS — I10 Essential (primary) hypertension: Secondary | ICD-10-CM

## 2015-08-20 DIAGNOSIS — H9193 Unspecified hearing loss, bilateral: Secondary | ICD-10-CM | POA: Diagnosis not present

## 2015-08-20 DIAGNOSIS — G3184 Mild cognitive impairment, so stated: Secondary | ICD-10-CM | POA: Diagnosis not present

## 2015-08-20 DIAGNOSIS — F32A Depression, unspecified: Secondary | ICD-10-CM

## 2015-08-20 DIAGNOSIS — Z0000 Encounter for general adult medical examination without abnormal findings: Secondary | ICD-10-CM

## 2015-08-20 DIAGNOSIS — Z23 Encounter for immunization: Secondary | ICD-10-CM

## 2015-08-20 DIAGNOSIS — E039 Hypothyroidism, unspecified: Secondary | ICD-10-CM | POA: Diagnosis not present

## 2015-08-20 MED ORDER — LEVOTHYROXINE SODIUM 75 MCG PO TABS: ORAL_TABLET | ORAL | Status: DC

## 2015-08-20 MED ORDER — AMLODIPINE BESYLATE 5 MG PO TABS: 5.0000 mg | ORAL_TABLET | Freq: Every day | ORAL | Status: DC

## 2015-08-20 MED ORDER — AMITRIPTYLINE HCL 75 MG PO TABS: 75.0000 mg | ORAL_TABLET | Freq: Every day | ORAL | Status: DC

## 2015-08-20 MED ORDER — HYDROXYZINE HCL 10 MG PO TABS: ORAL_TABLET | ORAL | Status: DC

## 2015-08-20 MED ORDER — TRAZODONE HCL 150 MG PO TABS: 150.0000 mg | ORAL_TABLET | Freq: Every day | ORAL | Status: DC

## 2015-08-20 NOTE — Addendum Note (Signed)
Addended byParticia Lather: Dacotah Cabello C on: 08/20/2015 03:15 PM   Modules accepted: Orders

## 2015-08-20 NOTE — Progress Notes (Signed)
Patient ID: Ricardo Alvarez, male   DOB: [birth year], 81 y.o.   MRN: [id number]    PCP: Sharon SellerEUBANKS, Natalee Tomkiewicz K, NP  Advanced Directive information Does patient have an advance directive?: No, Would patient like information on creating an advanced directive?: Yes - Educational materials given  No Known Allergies  Chief Complaint  Patient presents with  . Annual Exam    Annual Exam     HPI: Patient is a 81 y.o. male seen in the office today for annual exam.  Screenings: Colon Cancer- aged out Prostate Cancer- aged out  Depression screening [facility] 08/20/2015 05/21/2015 01/14/2015 04/18/2014 12/07/2012  Decreased Interest 0 0 0 0 0  Down, Depressed, Hopeless 0 0 0 0 -  PHQ - 2 Score 0 0 0 0 0   Falls Fall Risk  08/20/2015 08/20/2015 05/30/2015 05/21/2015 01/14/2015  Falls in the past year? Yes No Yes No No  Number falls in past yr: 1 - 1 - -  Injury with Fall? Yes - Yes - -  Follow up Falls prevention discussed - - - -   MMSE MMSE - Mini Mental State Exam 08/20/2015 08/14/2014  Not completed: (No Data) -  Orientation to time 5 5  Orientation to Place 5 5  Registration 3 3  Attention/ Calculation 5 5  Recall 2 1  Language- name 2 objects 2 2  Language- repeat 1 1  Language- follow 3 step command 3 3  Language- read & follow direction 1 1  Write a sentence 1 1  Copy design 1 0  Total score 29 27   Vaccines Immunization History  Administered Date(s) Administered  . Influenza,inj,Quad PF,36+ Mos 06/07/2013, 08/14/2014  . Influenza-Unspecified 06/01/2008, 06/09/2010  . Pneumococcal-Unspecified 08/24/1998    Smoking status:.former smoker Alcohol use: none  Dentist: yearly  Ophthalmologist: yearly, just had follow up  Exercise regimen: walks some every day- 5-10 mins  Diet: none   Functional Status of ADLs: independent of all ADLs, does his own finances, drives.  Incontinence-none   Very HOH- has had [facility] hearing check hearing aids.   Review of  Systems:  Review of Systems  Constitutional: Negative for fever, activity change, appetite change, fatigue and unexpected weight change.  HENT: Positive for hearing loss.   Respiratory: Negative for cough and shortness of breath.   Cardiovascular: Negative for chest pain and palpitations.  Gastrointestinal: Negative for abdominal pain, diarrhea and constipation.  Musculoskeletal: Positive for myalgias (to ribs). Negative for arthralgias.  Skin: Positive for wound (skin tears). Negative for color change.       Dry skin-itching  Neurological: Negative for dizziness, tremors, speech difficulty, weakness and light-headedness.    Past Medical History  Diagnosis Date  . Essential hypertension, benign   . Major depressive disorder, single episode, unspecified (HCC)   . Dermatophytosis of foot   . Nausea alone   . Insomnia due to medical condition classified elsewhere   . Chronic kidney disease, unspecified (HCC)   . Unspecified hypothyroidism   . Other premature beats   . Hypertrophy of prostate with urinary obstruction and other lower urinary tract symptoms (LUTS)   . Nonspecific abnormal electrocardiogram (ECG) (EKG)   . Hypertrophy of prostate without urinary obstruction and other lower urinary tract symptoms (LUTS)   . Nonspecific abnormal electrocardiogram (ECG) (EKG)   . Hypertrophy of prostate without urinary obstruction and other lower urinary tract symptoms (LUTS)   . Myalgia and myositis, unspecified   . Other malaise and fatigue   .  Encounter for long-term (current) use of other medications   . Special screening for malignant neoplasm of prostate   . Dyspepsia and other specified disorders of function of stomach   . Chest pain, unspecified   . Chronic ulcer of other specified site   . Diarrhea   . Insomnia, unspecified    Past Surgical History  Procedure Laterality Date  . Prostate surgery      5 times Dr Retia Passe  . Hernia repair    . Cataract extraction, bilateral     . Appendectomy    . Nose surgery    . Colonoscopy  2004    Dr.Patterson, Diverticulosis    Social History:   reports that he has quit smoking. He does not have any smokeless tobacco history on file. He reports that he does not drink alcohol or use illicit drugs.  Family History  Problem Relation Age of Onset  . Pneumonia Mother   . Stroke Father   . Emphysema Brother   . Cancer Brother   . Heart disease Sister     Medications: Patient's Medications  New Prescriptions   No medications on file  Previous Medications   AMITRIPTYLINE (ELAVIL) 75 MG TABLET    Take 1 tablet (75 mg total) by mouth at bedtime. Take one tablet at bedtime   AMLODIPINE (NORVASC) 5 MG TABLET    Take 1 tablet (5 mg total) by mouth daily.   CALCIUM CARBONATE (TUMS LASTING EFFECTS) 500 MG CHEWABLE TABLET    Chew 1 tablet by mouth daily. Take one tablet at bedtime   HYDROXYZINE (ATARAX/VISTARIL) 10 MG TABLET    10 mg by mouth every 8 hours as needed for itching   LEVOTHYROXINE (SYNTHROID, LEVOTHROID) 75 MCG TABLET    Take 1 tablet by mouth daily for Hypothyroidism   TRAMADOL (ULTRAM) 50 MG TABLET    Take 1 tablet (50 mg total) by mouth every 6 (six) hours as needed for moderate pain (may take 2 tablets as needed for SEVERE pain).   TRAZODONE (DESYREL) 150 MG TABLET    Take 1 tablet (150 mg total) by mouth at bedtime. to help with sleep  Modified Medications   No medications on file  Discontinued Medications   DOXYCYCLINE (VIBRA-TABS) 100 MG TABLET    Take 1 tablet (100 mg total) by mouth 2 (two) times daily.     Physical Exam:  Filed Vitals:   08/20/15 1345  BP: 122/76  Pulse: 92  Temp: 98 F (36.7 C)  TempSrc: Oral  Resp: 20  Height:  (1.702 m)  Weight: 145 lb 6.4 oz (65.953 kg)  SpO2: 93%   Body mass index is 22.77 kg/(m^2).  Physical Exam  Constitutional: He is oriented to person, place, and time. He appears well-developed and well-nourished. No distress.  HENT:  Head: Normocephalic  and atraumatic.  Mouth/Throat: Oropharynx is clear and moist. No oropharyngeal exudate.  Eyes: Conjunctivae and EOM are normal. Pupils are equal, round, and reactive to light.  Neck: Normal range of motion. Neck supple.  Cardiovascular: Normal rate, regular rhythm, normal heart sounds and intact distal pulses.   Pulmonary/Chest: Effort normal and breath sounds normal.  Abdominal: Soft. Bowel sounds are normal. He exhibits no distension. There is no tenderness.  Musculoskeletal: Normal range of motion. He exhibits no edema or tenderness.  Neurological: He is alert and oriented to person, place, and time.  Skin: Skin is warm and dry. He is not diaphoretic.  Dry flaky skin to abdomen, arms  and legs   Psychiatric: He has a normal mood and affect.    Labs reviewed: Basic Metabolic Panel:  Recent Labs  16/10/96 1336 05/21/15 1329  NA 141 137  K 4.7 4.9  CL 101 97  CO2 25 22  GLUCOSE 114* 110*  BUN 25 23  CREATININE 1.68* 1.93*  CALCIUM 9.3 9.5  TSH 0.642  --    Liver Function Tests:  Recent Labs  05/21/15 1329  AST 16  ALT 9  ALKPHOS 59  BILITOT 0.4  PROT 6.5  ALBUMIN 4.4   No results for input(s): LIPASE, AMYLASE in the last 8760 hours. No results for input(s): AMMONIA in the last 8760 hours. CBC:  Recent Labs  05/21/15 1329  WBC 8.4  NEUTROABS 5.5  HCT 42.1   Lipid Panel: No results for input(s): CHOL, HDL, LDLCALC, TRIG, CHOLHDL, LDLDIRECT in the last 8760 hours. TSH:  Recent Labs  01/14/15 1336  TSH 0.642   A1C: No results found for: HGBA1C   Assessment/Plan  1. Medicare annual wellness visit, subsequent The patient is doing well and no distinct problems were identified on exam. The patient was counseled regarding the appropriate use of alcohol, prevention of dental and periodontal disease, diet, regular sustained exercise for at least 30 minutes 5 times per week -to follow up with audiologist for hearing aids -dicussed fall precautions -cont  current medication -MMSE of 29/30   2. Depression -stable, will cont current regimen.  - amitriptyline (ELAVIL) 75 MG tablet; Take 1 tablet (75 mg total) by mouth at bedtime. Take one tablet at bedtime  Dispense: 90 tablet; Refill: 1  3. Mild cognitive impairment MMSE of 29/30, conts to live alone and do all iADL and ADLs, family helps pt as well.   4. Hypothyroidism, unspecified hypothyroidism type -TSH stable in May, cont current regimen  - levothyroxine (SYNTHROID, LEVOTHROID) 75 MCG tablet; Take 1 tablet by mouth daily for Hypothyroidism  Dispense: 90 tablet; Refill: 1  5. Essential hypertension -blood pressure stable, will cont current regimen - amLODipine (NORVASC) 5 MG tablet; Take 1 tablet (5 mg total) by mouth daily.  Dispense: 90 tablet; Refill: 1 - Comprehensive metabolic panel  Follow up in 3 months, for routine follow up and to follow up weight, noted weight loss from previous visit.   Janene Harvey. Biagio Borg  [facility] [phone] 8 am - 5 pm) [phone] (after hours)

## 2015-08-20 NOTE — Patient Instructions (Signed)
Make sure to have hearing aids check, we can place a referral if needed  Will follow up lab work today   Follow up in 3 months with Dr Chilton SiGreen for routine follow up and weight

## 2015-08-21 LAB — COMPREHENSIVE METABOLIC PANEL
ALBUMIN: 4.1 g/dL (ref 3.2–4.6)
ALT: 11 IU/L (ref 0–44)
AST: 13 IU/L (ref 0–40)
Albumin/Globulin Ratio: 1.8 (ref 1.1–2.5)
Alkaline Phosphatase: 69 IU/L (ref 39–117)
BUN/Creatinine Ratio: 11 (ref 10–22)
BUN: 20 mg/dL (ref 10–36)
Bilirubin Total: 0.4 mg/dL (ref 0.0–1.2)
CALCIUM: 9.2 mg/dL (ref 8.6–10.2)
CO2: 26 mmol/L (ref 18–29)
CREATININE: 1.79 mg/dL — AB (ref 0.76–1.27)
Chloride: 101 mmol/L (ref 96–106)
GFR, EST AFRICAN AMERICAN: 36 mL/min/{1.73_m2} — AB (ref >59)
GFR, EST NON AFRICAN AMERICAN: 31 mL/min/{1.73_m2} — AB (ref >59)
GLOBULIN, TOTAL: 2.3 g/dL (ref 1.5–4.5)
Glucose: 102 mg/dL — ABNORMAL HIGH (ref 65–99)
Potassium: 4.4 mmol/L (ref 3.5–5.2)
SODIUM: 140 mmol/L (ref 134–144)
TOTAL PROTEIN: 6.4 g/dL (ref 6.0–8.5)

## 2015-09-11 ENCOUNTER — Encounter: Payer: Self-pay | Admitting: Nurse Practitioner

## 2015-09-26 ENCOUNTER — Telehealth: Payer: Self-pay | Admitting: Nurse Practitioner

## 2015-09-26 NOTE — Telephone Encounter (Signed)
FYI - [facility] has tried to contact the patient several times to schedule an appointment. We have also tried calling the patient as well and also sent him a letter with no response from the patient

## 2015-11-19 ENCOUNTER — Encounter: Payer: Self-pay | Admitting: Internal Medicine

## 2015-11-19 ENCOUNTER — Ambulatory Visit (INDEPENDENT_AMBULATORY_CARE_PROVIDER_SITE_OTHER): Payer: Medicare Other | Admitting: Internal Medicine

## 2015-11-19 VITALS — BP 126/74 | HR 93 | Temp 98.2°F | Resp 17 | Ht 67.0 in | Wt 148.2 lb

## 2015-11-19 DIAGNOSIS — G4700 Insomnia, unspecified: Secondary | ICD-10-CM

## 2015-11-19 DIAGNOSIS — E039 Hypothyroidism, unspecified: Secondary | ICD-10-CM

## 2015-11-19 DIAGNOSIS — N182 Chronic kidney disease, stage 2 (mild): Secondary | ICD-10-CM

## 2015-11-19 DIAGNOSIS — L299 Pruritus, unspecified: Secondary | ICD-10-CM

## 2015-11-19 DIAGNOSIS — I10 Essential (primary) hypertension: Secondary | ICD-10-CM | POA: Diagnosis not present

## 2015-11-19 DIAGNOSIS — L853 Xerosis cutis: Secondary | ICD-10-CM

## 2015-11-19 MED ORDER — CETIRIZINE HCL 10 MG PO TABS: ORAL_TABLET | ORAL | Status: DC

## 2015-11-19 NOTE — Patient Instructions (Signed)
Apply Vaseline Intensive Care with Aloe to skin twice daily to help prevent itching. Start cetirizine 10 mg daily to help itching.

## 2015-11-19 NOTE — Progress Notes (Signed)
Patient ID: Ricardo Alvarez, male   DOB: 1915-11-05, 82 y.o.   MRN: [id number]   [facility]  Provider: Murray Hodgkins ,MD  Code Status: full Goals of Care:  Advanced Directives 11/19/2015  Does patient have an advance directive? No  Would patient like information on creating an advanced directive? No - patient declined information     Chief Complaint  Patient presents with  . Medical Management of Chronic Issues    3 month f/u and weight check    HPI: Patient is a 82 y.o. male seen today for medical management of chronic diseases.    Last seen 08/19/16 by Areatha Keas, NP.Problems discussed included hypertension, hypothyroidism, mild cognitive impairment, hearing loss, and depression. Weight loss was also noted. Patient has regained approximately 3 pounds since his last visit.  Mild cognitive impression - MMSE 29/30.  Depression- continue amitriptyline 75 mg at bedtime. Hypothyroidism - continue levothyroxine 75 g daily  Hypertension - continue amlodipine 5 mg daily  Insomnia is as bad as ever.   Past Medical History  Diagnosis Date  . Essential hypertension, benign   . Major depressive disorder, single episode, unspecified (HCC)   . Dermatophytosis of foot   . Nausea alone   . Insomnia due to medical condition classified elsewhere   . Chronic kidney disease, unspecified (HCC)   . Unspecified hypothyroidism   . Other premature beats   . Hypertrophy of prostate with urinary obstruction and other lower urinary tract symptoms (LUTS)   . Nonspecific abnormal electrocardiogram (ECG) (EKG)   . Hypertrophy of prostate without urinary obstruction and other lower urinary tract symptoms (LUTS)   . Nonspecific abnormal electrocardiogram (ECG) (EKG)   . Hypertrophy of prostate without urinary obstruction and other lower urinary tract symptoms (LUTS)   . Myalgia and myositis, unspecified   . Other malaise and fatigue   . Encounter for long-term (current) use of other  medications   . Special screening for malignant neoplasm of prostate   . Dyspepsia and other specified disorders of function of stomach   . Chest pain, unspecified   . Chronic ulcer of other specified site   . Diarrhea   . Insomnia, unspecified     Past Surgical History  Procedure Laterality Date  . Prostate surgery      5 times Dr Retia Passe  . Hernia repair    . Cataract extraction, bilateral    . Appendectomy    . Nose surgery    . Colonoscopy  2004    Dr.Patterson, Diverticulosis     No Known Allergies    Medication List       This list is accurate as of: 11/19/15  1:53 PM.  Always use your most recent med list.               amitriptyline 75 MG tablet  Commonly known as:  ELAVIL  Take 1 tablet (75 mg total) by mouth at bedtime. Take one tablet at bedtime     amLODipine 5 MG tablet  Commonly known as:  NORVASC  Take 1 tablet (5 mg total) by mouth daily.     levothyroxine 75 MCG tablet  Commonly known as:  SYNTHROID, LEVOTHROID  Take 1 tablet by mouth daily for Hypothyroidism     traMADol 50 MG tablet  Commonly known as:  ULTRAM  Take 1 tablet (50 mg total) by mouth every 6 (six) hours as needed for moderate pain (may take 2 tablets as needed for SEVERE  pain).     traZODone 150 MG tablet  Commonly known as:  DESYREL  Take 1 tablet (150 mg total) by mouth at bedtime. to help with sleep     TUMS LASTING EFFECTS 500 MG chewable tablet  Generic drug:  calcium carbonate  Chew 1 tablet by mouth daily. Take one tablet at bedtime        Review of Systems:  Review of Systems  Constitutional: Negative for fever, activity change, appetite change, fatigue and unexpected weight change.  HENT: Positive for hearing loss.   Respiratory: Negative for cough and shortness of breath.   Cardiovascular: Negative for chest pain and palpitations.  Gastrointestinal: Negative for abdominal pain, diarrhea and constipation.  Musculoskeletal: Positive for myalgias (to ribs).  Negative for arthralgias.  Skin: Positive for wound (skin tears). Negative for color change.       Dry skin-itching  Neurological: Negative for dizziness, tremors, speech difficulty, weakness and light-headedness.    Health Maintenance  Topic Date Due  . TETANUS/TDAP  09-08-1936  . ZOSTAVAX  09-08-1977  . INFLUENZA VACCINE  03/24/2016  . PNA vac Low Risk Adult  Completed    Physical Exam: Filed Vitals:   11/19/15 1331  BP: 126/74  Pulse: 93  Temp: 98.2 F (36.8 C)  TempSrc: Oral  Resp: 17  Height:  (1.702 m)  Weight: 148 lb 3.2 oz (67.223 kg)  SpO2: 95%   Body mass index is 23.21 kg/(m^2). Physical Exam  Constitutional: He is oriented to person, place, and time. He appears well-developed and well-nourished. No distress.  HENT:  Head: Normocephalic and atraumatic.  Mouth/Throat: Oropharynx is clear and moist. No oropharyngeal exudate.  Eyes: Conjunctivae and EOM are normal. Pupils are equal, round, and reactive to light.  Neck: Normal range of motion. Neck supple.  Cardiovascular: Normal rate, regular rhythm, normal heart sounds and intact distal pulses.   Pulmonary/Chest: Effort normal and breath sounds normal.  Abdominal: Soft. Bowel sounds are normal. He exhibits no distension. There is no tenderness.  Musculoskeletal: Normal range of motion. He exhibits no edema or tenderness.  Neurological: He is alert and oriented to person, place, and time.  Skin: Skin is warm and dry. He is not diaphoretic.  Dry flaky skin to abdomen, arms and legs   Psychiatric: He has a normal mood and affect.    Labs reviewed: Basic Metabolic Panel:  Recent Labs  78/29/56 1336 05/21/15 1329 08/20/15 1445  NA 141 137 140  K 4.7 4.9 4.4  CL 101 97 101  CO2 GLUCOSE 114* 110* 102*  BUN CREATININE 1.68* 1.93* 1.79*  CALCIUM 9.3 9.5 9.2  TSH 0.642  --   --    Liver Function Tests:  Recent Labs  05/21/15 1329 08/20/15 1445  AST 16 13  ALT 9 11  ALKPHOS  59 69  BILITOT 0.4 0.4  PROT 6.5 6.4  ALBUMIN 4.4 4.1   No results for input(s): LIPASE, AMYLASE in the last 8760 hours. No results for input(s): AMMONIA in the last 8760 hours. CBC:  Recent Labs  05/21/15 1329  WBC 8.4  NEUTROABS 5.5  HCT 42.1  MCV 90  PLT 211    Assessment/Plan 1. Xerosis cutis - Add Vaseline Intensive Care with Aloe to skin twice daily - cetirizine (ZYRTEC) 10 MG tablet; One daily to help control itching  Dispense: 90 tablet; Refill: 3  2. Pruritus See above  3. Essential hypertension Controlled  4. Insomnia Continue  current medications: Amitriptyline and trazodone  5. CKD (chronic kidney disease) stage 2, GFR 60-89 ml/min - Comprehensive metabolic panel; Future  6. Hypothyroidism, unspecified hypothyroidism type - TSH; Future

## 2016-03-17 NOTE — Addendum Note (Signed)
Addended by: Lodema Hong Axel Meas A on: 03/17/2016 03:41 PM   Modules accepted: Orders

## 2016-03-17 NOTE — Addendum Note (Signed)
Addended by: SIMPSON, MESHELL A on: 03/17/2016 03:41 PM   Modules accepted: Orders  

## 2016-03-17 NOTE — Addendum Note (Signed)
Addended by: Lodema Hong MESHELL A on: 03/17/2016 03:40 PM   Modules accepted: Orders

## 2016-03-20 ENCOUNTER — Other Ambulatory Visit: Payer: Medicare Other

## 2016-03-20 DIAGNOSIS — E039 Hypothyroidism, unspecified: Secondary | ICD-10-CM | POA: Diagnosis not present

## 2016-03-20 DIAGNOSIS — N182 Chronic kidney disease, stage 2 (mild): Secondary | ICD-10-CM

## 2016-03-20 LAB — COMPREHENSIVE METABOLIC PANEL
ALK PHOS: 57 U/L (ref 40–115)
ALT: 9 U/L (ref 9–46)
AST: 16 U/L (ref 10–35)
Albumin: 3.9 g/dL (ref 3.6–5.1)
BILIRUBIN TOTAL: 0.7 mg/dL (ref 0.2–1.2)
BUN: 21 mg/dL (ref 7–25)
CO2: 24 mmol/L (ref 20–31)
CREATININE: 1.91 mg/dL — AB (ref 0.70–1.11)
Calcium: 9 mg/dL (ref 8.6–10.3)
Chloride: 103 mmol/L (ref 98–110)
GLUCOSE: 92 mg/dL (ref 65–99)
POTASSIUM: 4.5 mmol/L (ref 3.5–5.3)
SODIUM: 137 mmol/L (ref 135–146)
Total Protein: 6.3 g/dL (ref 6.1–8.1)

## 2016-03-20 LAB — TSH: TSH: 4.19 m[IU]/L (ref 0.40–4.50)

## 2016-03-25 ENCOUNTER — Encounter: Payer: Self-pay | Admitting: Internal Medicine

## 2016-03-25 ENCOUNTER — Ambulatory Visit (INDEPENDENT_AMBULATORY_CARE_PROVIDER_SITE_OTHER): Payer: Medicare Other | Admitting: Internal Medicine

## 2016-03-25 VITALS — BP 148/86 | HR 94 | Temp 98.1°F | Ht 67.0 in | Wt 147.0 lb

## 2016-03-25 DIAGNOSIS — G3184 Mild cognitive impairment, so stated: Secondary | ICD-10-CM

## 2016-03-25 DIAGNOSIS — G4700 Insomnia, unspecified: Secondary | ICD-10-CM | POA: Diagnosis not present

## 2016-03-25 DIAGNOSIS — N182 Chronic kidney disease, stage 2 (mild): Secondary | ICD-10-CM

## 2016-03-25 DIAGNOSIS — L299 Pruritus, unspecified: Secondary | ICD-10-CM

## 2016-03-25 DIAGNOSIS — I10 Essential (primary) hypertension: Secondary | ICD-10-CM

## 2016-03-25 DIAGNOSIS — E039 Hypothyroidism, unspecified: Secondary | ICD-10-CM

## 2016-03-25 MED ORDER — AMLODIPINE BESYLATE 5 MG PO TABS: ORAL_TABLET | ORAL | 3 refills | Status: AC

## 2016-03-25 MED ORDER — LEVOTHYROXINE SODIUM 75 MCG PO TABS: ORAL_TABLET | ORAL | 3 refills | Status: AC

## 2016-03-25 MED ORDER — CYPROHEPTADINE HCL 4 MG PO TABS: ORAL_TABLET | ORAL | 5 refills | Status: DC

## 2016-03-25 NOTE — Progress Notes (Signed)
Facility  [facility]    Place of Service:   OFFICE    No Known Allergies  Chief Complaint  Patient presents with  . Medical Management of Chronic Issues    4 month medication management thyroid, blood pressure, CKD    HPI:  Hypothyroidism, unspecified hypothyroidism type - compensated  Essential hypertension  - mild increase in the SBP today. Tolerating current medications. Denies headache, palpitations, chest pain.  CKD (chronic kidney disease) stage 2, GFR 60-89 ml/min - stable on recent lab with normal BUN and elevated creatinine at 1.91.  Mild cognitive impairment -continues to say he notes a change in his memory. MMSE in 2015 was 27/30. Today was 28/30.   Insomnia - Stress he gets up to 8 hours per night when he uses his current medication for sleep.  Medication for itching did not help. Wants to try something else.   Medications: Patient's Medications  New Prescriptions   No medications on file  Previous Medications   AMITRIPTYLINE (ELAVIL) 75 MG TABLET    Take 1 tablet (75 mg total) by mouth at bedtime. Take one tablet at bedtime   AMLODIPINE (NORVASC) 5 MG TABLET    Take 1 tablet (5 mg total) by mouth daily.   CALCIUM CARBONATE (TUMS LASTING EFFECTS) 500 MG CHEWABLE TABLET    Chew 1 tablet by mouth daily. Take one tablet at bedtime   CETIRIZINE (ZYRTEC) 10 MG TABLET    One daily to help control itching   LEVOTHYROXINE (SYNTHROID, LEVOTHROID) 75 MCG TABLET    Take 1 tablet by mouth daily for Hypothyroidism   TRAMADOL (ULTRAM) 50 MG TABLET    Take 1 tablet (50 mg total) by mouth every 6 (six) hours as needed for moderate pain (may take 2 tablets as needed for SEVERE pain).   TRAZODONE (DESYREL) 150 MG TABLET    Take 1 tablet (150 mg total) by mouth at bedtime. to help with sleep  Modified Medications   No medications on file  Discontinued Medications   No medications on file    Review of Systems  Constitutional: Negative for activity change, appetite change,  fatigue, fever and unexpected weight change.  HENT: Positive for hearing loss.   Respiratory: Negative for cough and shortness of breath.   Cardiovascular: Negative for chest pain and palpitations.  Gastrointestinal: Negative for abdominal pain, constipation and diarrhea.  Musculoskeletal: Positive for myalgias (to ribs). Negative for arthralgias.  Skin: Positive for wound (skin tears). Negative for color change.       Dry skin-itching  Neurological: Negative for dizziness, tremors, speech difficulty, weakness and light-headedness.    Vitals:   03/25/16 1504  BP: (!) 148/86  Pulse: 94  Temp: 98.1 F (36.7 C)  TempSrc: Oral  SpO2: 96%  Weight: 147 lb (66.7 kg)  Height: _0  (1.702 m)   Body mass index is 23.02 kg/m. Wt Readings from Last 3 Encounters:  03/25/16 147 lb (66.7 kg)  11/19/15 148 lb 3.2 oz (67.2 kg)  08/20/15 145 lb 6.4 oz (66 kg)      Physical Exam  Constitutional: He is oriented to person, place, and time. He appears well-developed and well-nourished. No distress.  HENT:  Head: Normocephalic and atraumatic.  Mouth/Throat: Oropharynx is clear and moist. No oropharyngeal exudate.  Severe partial deafness  Eyes: Conjunctivae and EOM are normal. Pupils are equal, round, and reactive to light.  Neck: Normal range of motion. Neck supple.  Cardiovascular: Normal rate, regular rhythm, normal heart sounds and  intact distal pulses.   Pulmonary/Chest: Effort normal and breath sounds normal.  Abdominal: Soft. Bowel sounds are normal. He exhibits no distension. There is no tenderness.  Musculoskeletal: Normal range of motion. He exhibits no edema or tenderness.  Neurological: He is alert and oriented to person, place, and time.  08/14/14 MMSE 27/ 30. Passed clock drawing. 03/25/16 MMSE 28/30. Passed clock drawing  Skin: Skin is warm and dry. He is not diaphoretic.  Dry flaky skin to abdomen, arms and legs   Psychiatric: He has a normal mood and affect.    Labs  reviewed: Lab Summary Latest Ref Rng & Units 03/20/2016 08/20/2015 05/21/2015  Hemoglobin 12.6 - 17.7 g/dL (None) (None) 14.4  Hematocrit 37.5 - 51.0 % (None) (None) 42.1  White count 3.4 - 10.8 x10E3/uL (None) (None) 8.4  Platelet count 150 - 379 x10E3/uL (None) (None) 211  Sodium 135 - 146 mmol/L 137 140 137  Potassium 3.5 - 5.3 mmol/L 4.5 4.4 4.9  Calcium 8.6 - 10.3 mg/dL 9.0 9.2 9.5  Phosphorus - (None) (None) (None)  Creatinine 0.70 - 1.11 mg/dL 1.91(H) 1.79(H) 1.93(H)  AST 10 - 35 U/L _0 Alk Phos 40 - 115 U/L 57 69 59  Bilirubin 0.2 - 1.2 mg/dL 0.7 0.4 0.4  Glucose 65 - 99 mg/dL 92 102(H) 110(H)  Cholesterol - (None) (None) (None)  HDL cholesterol - (None) (None) (None)  Triglycerides - (None) (None) (None)  LDL Direct - (None) (None) (None)  LDL Calc - (None) (None) (None)  Total protein 6.1 - 8.1 g/dL 6.3 (None) (None)  Albumin 3.6 - 5.1 g/dL 3.9 4.1 4.4  Some recent data might be hidden   Lab Results  Component Value Date   TSH 4.19 03/20/2016   TSH 0.642 01/14/2015   TSH 1.290 04/18/2014   Lab Results  Component Value Date   BUN 21 03/20/2016   BUN 20 08/20/2015   BUN 23 05/21/2015   No results found for: HGBA1C  Assessment/Plan 1. Hypothyroidism, unspecified hypothyroidism type - levothyroxine (SYNTHROID, LEVOTHROID) 75 MCG tablet; Onedaily for thyroid supplement  Dispense: 90 tablet; Refill: 3  2. Essential hypertension - amLODipine (NORVASC) 5 MG tablet; One daly to control BP  Dispense: 90 tablet; Refill: 3  3. CKD (chronic kidney disease) stage 2, GFR 60-89 ml/min stable  4. Mild cognitive impairment stable  5. Insomnia Continue current meds  6. Pruritus - cyproheptadine (PERIACTIN) 4 MG tablet; One twice daily to help itching  Dispense: 60 tablet; Refill: 5

## 2016-07-07 ENCOUNTER — Telehealth: Payer: Self-pay | Admitting: Nurse Practitioner

## 2016-07-07 NOTE — Telephone Encounter (Signed)
left msg asking pt to confirm this AWV/EV with NHA and Jessica. VDM (DD)

## 2016-08-07 ENCOUNTER — Telehealth: Payer: Self-pay | Admitting: Nurse Practitioner

## 2016-08-07 NOTE — Telephone Encounter (Signed)
left another msg asking pt to call and confirm this AWV/EV appt w/ nurse. VDM (DD) °

## 2016-08-19 NOTE — Telephone Encounter (Signed)
left another msg asking pt to call and confirm this AWV/EV appt w/ nurse. VDM (DD)

## 2016-08-20 ENCOUNTER — Ambulatory Visit: Payer: Medicare Other | Admitting: Nurse Practitioner

## 2016-08-20 ENCOUNTER — Ambulatory Visit: Payer: Medicare Other

## 2016-08-21 DIAGNOSIS — Z961 Presence of intraocular lens: Secondary | ICD-10-CM | POA: Diagnosis not present

## 2016-08-21 DIAGNOSIS — H40013 Open angle with borderline findings, low risk, bilateral: Secondary | ICD-10-CM | POA: Diagnosis not present

## 2016-08-21 DIAGNOSIS — H353110 Nonexudative age-related macular degeneration, right eye, stage unspecified: Secondary | ICD-10-CM | POA: Diagnosis not present

## 2016-08-21 DIAGNOSIS — H353120 Nonexudative age-related macular degeneration, left eye, stage unspecified: Secondary | ICD-10-CM | POA: Diagnosis not present

## 2016-09-30 ENCOUNTER — Encounter: Payer: Self-pay | Admitting: Internal Medicine

## 2016-09-30 ENCOUNTER — Ambulatory Visit (INDEPENDENT_AMBULATORY_CARE_PROVIDER_SITE_OTHER): Payer: Medicare Other | Admitting: Internal Medicine

## 2016-09-30 VITALS — BP 130/72 | HR 77 | Temp 98.0°F | Ht 67.0 in | Wt 146.0 lb

## 2016-09-30 DIAGNOSIS — F3341 Major depressive disorder, recurrent, in partial remission: Secondary | ICD-10-CM

## 2016-09-30 DIAGNOSIS — I10 Essential (primary) hypertension: Secondary | ICD-10-CM

## 2016-09-30 DIAGNOSIS — Z23 Encounter for immunization: Secondary | ICD-10-CM

## 2016-09-30 DIAGNOSIS — E039 Hypothyroidism, unspecified: Secondary | ICD-10-CM | POA: Diagnosis not present

## 2016-09-30 DIAGNOSIS — L299 Pruritus, unspecified: Secondary | ICD-10-CM | POA: Diagnosis not present

## 2016-09-30 DIAGNOSIS — N182 Chronic kidney disease, stage 2 (mild): Secondary | ICD-10-CM

## 2016-09-30 DIAGNOSIS — G3184 Mild cognitive impairment, so stated: Secondary | ICD-10-CM

## 2016-09-30 MED ORDER — AMITRIPTYLINE HCL 75 MG PO TABS: 75.0000 mg | ORAL_TABLET | Freq: Every day | ORAL | 1 refills | Status: AC

## 2016-09-30 MED ORDER — TRAZODONE HCL 150 MG PO TABS: 150.0000 mg | ORAL_TABLET | Freq: Every day | ORAL | 3 refills | Status: AC

## 2016-09-30 NOTE — Progress Notes (Signed)
Facility  [place]    Place of Service:   OFFICE    No Known Allergies  Chief Complaint  Patient presents with  . Medical Management of Chronic Issues    6 month medication management blood pressure, thyroid, CKD    HPI:  Essential hypertension - controlled  Hypothyroidism, unspecified type - needs follow up TSH  CKD (chronic kidney disease) stage 2, GFR 60-89 ml/min - needs follow up lab  Mild cognitive impairment - he continues to think his memory is deteriorating. Says it I no worse. Plays cards on the computer. Reads a lot. Watches TV. Most time is alone. Has 2 sons, but they don't see [place] much; [place].  Pruritus - no better. Says nothing has helped. He stopped Periactin and cetirizine  Recurrent major depressive disorder, in partial remission (HCC) - unchanged   Medications: Patient's Medications  New Prescriptions   No medications on file  Previous Medications   AMITRIPTYLINE (ELAVIL) 75 MG TABLET    Take 1 tablet (75 mg total) by mouth at bedtime. Take one tablet at bedtime   AMLODIPINE (NORVASC) 5 MG TABLET    One daly to control BP   CALCIUM CARBONATE (TUMS LASTING EFFECTS) 500 MG CHEWABLE TABLET    Chew 1 tablet by mouth daily. Take one tablet at bedtime   CETIRIZINE (ZYRTEC) 10 MG TABLET    One daily to help control itching   CYPROHEPTADINE (PERIACTIN) 4 MG TABLET    One twice daily to help itching   LEVOTHYROXINE (SYNTHROID, LEVOTHROID) 75 MCG TABLET    Onedaily for thyroid supplement   TRAZODONE (DESYREL) 150 MG TABLET    Take 1 tablet (150 mg total) by mouth at bedtime. to help with sleep  Modified Medications   No medications on file  Discontinued Medications   TRAMADOL (ULTRAM) 50 MG TABLET    Take 1 tablet (50 mg total) by mouth every 6 (six) hours as needed for moderate pain (may take 2 tablets as needed for SEVERE pain).    Review of Systems  Constitutional: Negative for activity change, appetite change, fatigue,  fever and unexpected weight change.  HENT: Positive for hearing loss. Negative for congestion, ear pain, rhinorrhea, sore throat, tinnitus, trouble swallowing and voice change.   Eyes: Positive for visual disturbance (corrective lenses).       Corrective lenses  Respiratory: Negative for cough, choking, chest tightness, shortness of breath and wheezing.   Cardiovascular: Negative for chest pain, palpitations and leg swelling.  Gastrointestinal: Negative for abdominal distention, abdominal pain, constipation, diarrhea and nausea.  Endocrine: Negative for cold intolerance, heat intolerance, polydipsia, polyphagia and polyuria.  Genitourinary: Negative for dysuria, frequency, testicular pain and urgency.       Not incontinent  Musculoskeletal: Positive for myalgias (to ribs). Negative for arthralgias, back pain, gait problem and neck pain.  Skin: Negative for color change, pallor, rash and wound.       Dry skin-itching  Allergic/Immunologic: Negative.   Neurological: Negative for dizziness, tremors, syncope, speech difficulty, weakness, light-headedness, numbness and headaches.  Hematological: Negative for adenopathy. Does not bruise/bleed easily.  Psychiatric/Behavioral: Negative for behavioral problems, confusion, decreased concentration, hallucinations and sleep disturbance. The patient is not nervous/anxious.     Vitals:   09/30/16 1457  BP: 130/72  Pulse: 77  Temp: 98 F (36.7 C)  TempSrc: Oral  SpO2: 90%  Weight: 146 lb (66.2 kg)  Height: 5' 7"  (1.702 m)   Body  mass index is 22.87 kg/m. Wt Readings from Last 3 Encounters:  09/30/16 146 lb (66.2 kg)  03/25/16 147 lb (66.7 kg)  11/19/15 148 lb 3.2 oz (67.2 kg)      Physical Exam  Constitutional: He is oriented to person, place, and time. He appears well-developed and well-nourished. No distress.  Thin and frail  HENT:  Head: Normocephalic and atraumatic.  Right Ear: External ear normal.  Left Ear: External ear normal.    Nose: Nose normal.  Mouth/Throat: Oropharynx is clear and moist. No oropharyngeal exudate.  Severe partial deafness  Eyes: Conjunctivae and EOM are normal. Pupils are equal, round, and reactive to light.  Neck: Normal range of motion. Neck supple. No JVD present. No tracheal deviation present. No thyromegaly present.  Cardiovascular: Normal rate, regular rhythm, normal heart sounds and intact distal pulses.  Exam reveals no gallop and no friction rub.   No murmur heard. Pulmonary/Chest: Effort normal and breath sounds normal. No respiratory distress. He has no wheezes. He has no rales. He exhibits no tenderness.  Abdominal: Soft. Bowel sounds are normal. He exhibits no distension and no mass. There is no tenderness.  Musculoskeletal: Normal range of motion. He exhibits no edema or tenderness.  Lymphadenopathy:    He has no cervical adenopathy.  Neurological: He is alert and oriented to person, place, and time. He has normal reflexes. No cranial nerve deficit. Coordination normal.  08/14/14 MMSE 27/ 30. Passed clock drawing. 03/25/16 MMSE 28/30. Passed clock drawing  Skin: Skin is warm and dry. No rash noted. He is not diaphoretic. No erythema. No pallor.  Dry flaky skin to abdomen, arms and legs   Psychiatric: He has a normal mood and affect. His behavior is normal. Judgment and thought content normal.    Labs reviewed: Lab Summary Latest Ref Rng & Units 03/20/2016 08/20/2015 05/21/2015  Hemoglobin 12.6 - 17.7 g/dL (None) (None) 14.4  Hematocrit 37.5 - 51.0 % (None) (None) 42.1  White count 3.4 - 10.8 x10E3/uL (None) (None) 8.4  Platelet count 150 - 379 x10E3/uL (None) (None) 211  Sodium 135 - 146 mmol/L 137 140 137  Potassium 3.5 - 5.3 mmol/L 4.5 4.4 4.9  Calcium 8.6 - 10.3 mg/dL 9.0 9.2 9.5  Phosphorus - (None) (None) (None)  Creatinine 0.70 - 1.11 mg/dL 1.91(H) 1.79(H) 1.93(H)  AST 10 - 35 U/L 16 13 16   Alk Phos 40 - 115 U/L 57 69 59  Bilirubin 0.2 - 1.2 mg/dL 0.7 0.4 0.4  Glucose  65 - 99 mg/dL 92 102(H) 110(H)  Cholesterol - (None) (None) (None)  HDL cholesterol - (None) (None) (None)  Triglycerides - (None) (None) (None)  LDL Direct - (None) (None) (None)  LDL Calc - (None) (None) (None)  Total protein 6.1 - 8.1 g/dL 6.3 (None) (None)  Albumin 3.6 - 5.1 g/dL 3.9 4.1 4.4  Some recent data might be hidden   Lab Results  Component Value Date   TSH 4.19 03/20/2016   TSH 0.642 01/14/2015   TSH 1.290 04/18/2014   Lab Results  Component Value Date   BUN 21 03/20/2016   BUN 20 08/20/2015   BUN 23 05/21/2015   No results found for: HGBA1C  Assessment/Plan  1. Essential hypertension The current medical regimen is effective;  continue present plan and medications. - Comprehensive metabolic panel; Future  2. Hypothyroidism, unspecified type The current medical regimen is effective;  continue present plan and medications. - TSH; Future  3. CKD (chronic kidney disease) stage 2, GFR 60-89  ml/min - Comprehensive metabolic panel; Future  4. Mild cognitive impairment MMSE next visit  5. Pruritus observe  6. Recurrent major depressive disorder, in partial remission (HCC) - traZODone (DESYREL) 150 MG tablet; Take 1 tablet (150 mg total) by mouth at bedtime. to help with sleep  Dispense: 90 tablet; Refill: 3 - amitriptyline (ELAVIL) 75 MG tablet; Take 1 tablet (75 mg total) by mouth at bedtime. Take one tablet at bedtime  Dispense: 90 tablet; Refill: 1

## 2016-11-19 DIAGNOSIS — I469 Cardiac arrest, cause unspecified: Secondary | ICD-10-CM | POA: Diagnosis not present

## 2016-11-20 DIAGNOSIS — I469 Cardiac arrest, cause unspecified: Secondary | ICD-10-CM | POA: Diagnosis not present

## 2016-11-22 DIAGNOSIS — 419620001 Death: Secondary | SNOMED CT | POA: Diagnosis not present

## 2016-11-22 DEATH — deceased

## 2017-04-01 ENCOUNTER — Ambulatory Visit: Payer: Medicare Other | Admitting: Internal Medicine
